# Patient Record
Sex: Female | Born: 1990 | Race: Black or African American | Hispanic: No | Marital: Single | Smoking: Former smoker
Health system: Southern US, Community
[De-identification: ages and names within clinical notes are randomized; demographics above are authoritative.]

## PROBLEM LIST (undated history)

## (undated) ENCOUNTER — Inpatient Hospital Stay (HOSPITAL_COMMUNITY): Payer: Self-pay

## (undated) DIAGNOSIS — B019 Varicella without complication: Secondary | ICD-10-CM

## (undated) DIAGNOSIS — N390 Urinary tract infection, site not specified: Secondary | ICD-10-CM

## (undated) DIAGNOSIS — R519 Headache, unspecified: Secondary | ICD-10-CM

## (undated) DIAGNOSIS — F329 Major depressive disorder, single episode, unspecified: Secondary | ICD-10-CM

## (undated) DIAGNOSIS — F32A Depression, unspecified: Secondary | ICD-10-CM

## (undated) DIAGNOSIS — R51 Headache: Secondary | ICD-10-CM

## (undated) DIAGNOSIS — F1010 Alcohol abuse, uncomplicated: Secondary | ICD-10-CM

## (undated) DIAGNOSIS — F509 Eating disorder, unspecified: Secondary | ICD-10-CM

## (undated) HISTORY — DX: Eating disorder, unspecified: F50.9

## (undated) HISTORY — DX: Headache: R51

## (undated) HISTORY — PX: ROOT CANAL: SHX2363

## (undated) HISTORY — DX: Alcohol abuse, uncomplicated: F10.10

## (undated) HISTORY — DX: Headache, unspecified: R51.9

## (undated) HISTORY — DX: Varicella without complication: B01.9

## (undated) HISTORY — DX: Urinary tract infection, site not specified: N39.0

## (undated) HISTORY — DX: Depression, unspecified: F32.A

## (undated) HISTORY — DX: Major depressive disorder, single episode, unspecified: F32.9

---
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
## 2014-05-15 ENCOUNTER — Other Ambulatory Visit (HOSPITAL_COMMUNITY)
Admission: RE | Admit: 2014-05-15 | Discharge: 2014-05-15 | Disposition: A | Discharge status: Home / Self Care | Payer: 59 | Source: Ambulatory Visit | Attending: Physician Assistant | Admitting: Physician Assistant

## 2014-05-15 ENCOUNTER — Encounter: Payer: Self-pay | Admitting: Physician Assistant

## 2014-05-15 ENCOUNTER — Ambulatory Visit (INDEPENDENT_AMBULATORY_CARE_PROVIDER_SITE_OTHER): Payer: 59 | Admitting: Physician Assistant

## 2014-05-15 VITALS — BP 117/82 | HR 78 | Temp 98.5°F | Resp 16 | Ht 65.25 in | Wt 123.5 lb

## 2014-05-15 DIAGNOSIS — J208 Acute bronchitis due to other specified organisms: Secondary | ICD-10-CM

## 2014-05-15 DIAGNOSIS — Z202 Contact with and (suspected) exposure to infections with a predominantly sexual mode of transmission: Secondary | ICD-10-CM

## 2014-05-15 DIAGNOSIS — B9689 Other specified bacterial agents as the cause of diseases classified elsewhere: Secondary | ICD-10-CM

## 2014-05-15 DIAGNOSIS — N760 Acute vaginitis: Secondary | ICD-10-CM | POA: Insufficient documentation

## 2014-05-15 DIAGNOSIS — Z113 Encounter for screening for infections with a predominantly sexual mode of transmission: Secondary | ICD-10-CM | POA: Insufficient documentation

## 2014-05-15 DIAGNOSIS — J00 Acute nasopharyngitis [common cold]: Secondary | ICD-10-CM

## 2014-05-15 DIAGNOSIS — Z654 Victim of crime and terrorism: Secondary | ICD-10-CM

## 2014-05-15 LAB — ACUTE HEP PANEL AND HEP B SURFACE AB
HCV Ab: NEGATIVE
Hep A IgM: NONREACTIVE
Hep B C IgM: NONREACTIVE
Hep B S Ab: NEGATIVE
Hepatitis B Surface Ag: NEGATIVE

## 2014-05-15 LAB — HIV ANTIBODY (ROUTINE TESTING W REFLEX): HIV 1&2 Ab, 4th Generation: NONREACTIVE

## 2014-05-15 LAB — RPR

## 2014-05-15 MED ORDER — AZITHROMYCIN 250 MG PO TABS
ORAL_TABLET | ORAL | Status: DC
Start: 2014-05-15 — End: 2014-06-14

## 2014-05-15 NOTE — Progress Notes (Signed)
Pre visit review using our clinic review tool, if applicable. No additional management support is needed unless otherwise documented below in the visit note/SLS  

## 2014-05-15 NOTE — Patient Instructions (Signed)
Please stop by the lab. I will call you with all of your results. If anything comes back abnormal, we will treat you accordingly.  Please use the sheets I have given you to set up a counseling appointment.  Program the Crisis Hotline numbers into your phone.  Please follow through with your police report and restraining order.  Make sure to always leave work with a Radio broadcast assistantcoworker. Stay with friends and family some or have someone come stay with you until you have the police involved.   For your respiratory symptoms, please take the antibiotic as directed.  Stay well hydrated.  Use Mucinex for cough and congestion.   Domestic Abuse You are being battered or abused if someone close to you hits, pushes, or physically hurts you in any way. You also are being abused if you are forced into activities. You are being sexually abused if you are forced to have sexual contact of any kind. You are being emotionally abused if you are made to feel worthless or if you are constantly threatened. It is important to remember that help is available. No one has the right to abuse you. PREVENTION OF FURTHER ABUSE  Learn the warning signs of danger. This varies with situations but may include: the use of alcohol, threats, isolation from friends and family, or forced sexual contact. Leave if you feel that violence is going to occur.  If you are attacked or beaten, report it to the police so the abuse is documented. You do not have to press charges. The police can protect you while you or the attackers are leaving. Get the officer's name and badge number and a copy of the report.  Find someone you can trust and tell them what is happening to you: your caregiver, a nurse, clergy member, close friend or family member. Feeling ashamed is natural, but remember that you have done nothing wrong. No one deserves abuse.  It is important to develop a safety plan if you decide to leave the relationship. You may be at risk of harm if your  abuser knows you are leaving. Include the following steps in your plan:  Keep extra clothing for you and your children, medicines, money, important phone numbers and papers, and an extra set of car and house keys at a friend's or neighbor's house.  Tell a supportive friend or family member that you may show up at any time of day or night in an emergency.  If you do not have a close friend or family member, make a list of other safe places to go, such as shelters and crisis centers. Keep an abuse hotline number available.  Consider filing a restraining order if your abuser stalks, harasses, or threatens you in any way. Document Released: 03/19/2000 Document Revised: 06/14/2011 Document Reviewed: 05/28/2010 [facility] Patient Information 2015 [employer], [place]. This information is not intended to replace advice given to you by your health care provider. Make sure you discuss any questions you have with your health care provider.
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
## 2014-05-16 ENCOUNTER — Telehealth: Payer: Self-pay | Admitting: Physician Assistant

## 2014-05-16 LAB — HSV(HERPES SMPLX)ABS-I+II(IGG+IGM)-BLD
HSV 1 Glycoprotein G Ab, IgG: <0.1 IV
HSV 2 GLYCOPROTEIN G AB, IGG: 3.94 IV — AB
Herpes Simplex Vrs I&II-IgM Ab (EIA): 1.57 INDEX — ABNORMAL HIGH

## 2014-05-16 LAB — URINE CYTOLOGY ANCILLARY ONLY
CHLAMYDIA, DNA PROBE: NEGATIVE
NEISSERIA GONORRHEA: NEGATIVE
TRICH (WINDOWPATH): NEGATIVE

## 2014-05-16 NOTE — Telephone Encounter (Signed)
emmi emailed °

## 2014-05-17 ENCOUNTER — Telehealth: Payer: Self-pay | Admitting: Physician Assistant

## 2014-05-17 DIAGNOSIS — N760 Acute vaginitis: Principal | ICD-10-CM

## 2014-05-17 DIAGNOSIS — B9689 Other specified bacterial agents as the cause of diseases classified elsewhere: Secondary | ICD-10-CM

## 2014-05-17 LAB — URINE CYTOLOGY ANCILLARY ONLY
BACTERIAL VAGINITIS: POSITIVE — AB
Bacterial vaginitis: POSITIVE — AB
Bacterial vaginitis: POSITIVE — AB
Bacterial vaginitis: POSITIVE — AB
Candida vaginitis: NEGATIVE

## 2014-05-17 MED ORDER — METRONIDAZOLE 0.75 % EX GEL: CUTANEOUS | Status: DC

## 2014-05-17 NOTE — Telephone Encounter (Signed)
Ancillary repeat test is positive for bacterial vaginitis.  This is due to overgrowth of bacteria in the vagina and is actually pretty common. I have sent in Metrogel to apply to the vagina nightly x 5 days to help clear the infection.

## 2014-05-19 ENCOUNTER — Encounter: Payer: Self-pay | Admitting: Physician Assistant

## 2014-05-19 DIAGNOSIS — J208 Acute bronchitis due to other specified organisms: Secondary | ICD-10-CM

## 2014-05-19 DIAGNOSIS — B9689 Other specified bacterial agents as the cause of diseases classified elsewhere: Secondary | ICD-10-CM | POA: Insufficient documentation

## 2014-05-19 DIAGNOSIS — Z202 Contact with and (suspected) exposure to infections with a predominantly sexual mode of transmission: Secondary | ICD-10-CM | POA: Insufficient documentation

## 2014-05-19 DIAGNOSIS — Z654 Victim of crime and terrorism: Secondary | ICD-10-CM | POA: Insufficient documentation

## 2014-05-19 NOTE — Assessment & Plan Note (Signed)
Rx Azithromycin.  Increase fluids.  Rest.  Mucinex-DM for congestion and cough.  Return precautions discussed with patient.

## 2014-05-19 NOTE — Assessment & Plan Note (Addendum)
Patient endorsed.  Discussed need for patient to involve police and to file a police report.  Patient encouraged to stay with friends and family.  Instructed to take different routes to and from work daily and to always leave work with a co-worker. Emergency numbers, including the crisis hotline given to the patient. Patient instructed that the police are the best bet to ensure her safety, but if she is alone and is fearful of being alone, she is welcome to come hang out in our office.  I will be having our office periodically follow-up with patient to ensure she is following through with police involvement.

## 2014-05-19 NOTE — Assessment & Plan Note (Signed)
Asymptomatic. Will obtain STI testing today.  Will treat if indicated by results.

## 2014-05-20 NOTE — Telephone Encounter (Signed)
Patient Unavailable; female spoken with will have patient return call/SLS

## 2014-05-20 NOTE — Telephone Encounter (Signed)
Patient informed, understood & agreed/SLS  

## 2014-05-21 ENCOUNTER — Telehealth: Payer: Self-pay | Admitting: Physician Assistant

## 2014-05-21 DIAGNOSIS — N760 Acute vaginitis: Principal | ICD-10-CM

## 2014-05-21 DIAGNOSIS — B9689 Other specified bacterial agents as the cause of diseases classified elsewhere: Secondary | ICD-10-CM

## 2014-05-21 MED ORDER — METRONIDAZOLE 500 MG PO TABS: 500.0000 mg | ORAL_TABLET | Freq: Two times a day (BID) | ORAL | Status: DC

## 2014-05-21 NOTE — Telephone Encounter (Signed)
Caller name: Tiasia Relation to pt: self Call back number:  [phone] Pharmacy: [facility] on [address] in [place],   Reason for call:   Patient states that the gel that was prescribed to her is too expensive and would like something else called in.

## 2014-05-21 NOTE — Telephone Encounter (Signed)
Rx Flagyl oral. Take as directed.

## 2014-05-22 MED ORDER — METRONIDAZOLE 500 MG PO TABS: 500.0000 mg | ORAL_TABLET | Freq: Two times a day (BID) | ORAL | Status: DC

## 2014-05-22 NOTE — Telephone Encounter (Signed)
Patient informed, understood & agreed; new Rx to pharmacy requested by patient/SLS

## 2014-05-30 ENCOUNTER — Telehealth: Payer: Self-pay | Admitting: Physician Assistant

## 2014-05-30 NOTE — Telephone Encounter (Signed)
error:[id number] ° °

## 2014-06-14 ENCOUNTER — Telehealth: Payer: Self-pay

## 2014-06-14 NOTE — Telephone Encounter (Signed)
Medication: Review, verify sig & reconcile(including outside meds):  yes Duplicates discarded: n/a DM supply source:  n/a  Local pharmacy: [facility]   Allergies verified:  yes  Immunization Status: Prompted for insurance verification: n/a Flu vaccine--declined Tdap--up to date per patient; unsure of date  A/P:   Changes to FH, PSH or Personal Hx:  Reviewed.  No changes. Pap--DUE--would like to receive a PAP during visit.     Care Teams Updated: ED/Hospital/Urgent Care Visits:  No Prompted for: Updated insurance, contact information, forms:  yes Remind to bring: DPR information, advance directives:  yes  To Discuss with Provider: Nothing at this time.

## 2014-06-17 ENCOUNTER — Encounter: Payer: Self-pay | Admitting: Physician Assistant

## 2014-06-17 ENCOUNTER — Other Ambulatory Visit (HOSPITAL_COMMUNITY)
Admission: RE | Admit: 2014-06-17 | Discharge: 2014-06-17 | Disposition: A | Discharge status: Home / Self Care | Payer: 59 | Source: Ambulatory Visit | Attending: Physician Assistant | Admitting: Physician Assistant

## 2014-06-17 ENCOUNTER — Ambulatory Visit (INDEPENDENT_AMBULATORY_CARE_PROVIDER_SITE_OTHER): Payer: 59 | Admitting: Physician Assistant

## 2014-06-17 VITALS — BP 125/73 | HR 69 | Temp 99.4°F | Resp 16 | Ht 65.25 in | Wt 119.1 lb

## 2014-06-17 DIAGNOSIS — Z0000 Encounter for general adult medical examination without abnormal findings: Secondary | ICD-10-CM

## 2014-06-17 DIAGNOSIS — F411 Generalized anxiety disorder: Secondary | ICD-10-CM

## 2014-06-17 DIAGNOSIS — Z124 Encounter for screening for malignant neoplasm of cervix: Secondary | ICD-10-CM

## 2014-06-17 DIAGNOSIS — Z01419 Encounter for gynecological examination (general) (routine) without abnormal findings: Secondary | ICD-10-CM | POA: Diagnosis present

## 2014-06-17 MED ORDER — CITALOPRAM HYDROBROMIDE 10 MG PO TABS: 10.0000 mg | ORAL_TABLET | Freq: Every day | ORAL | Status: DC

## 2014-06-17 NOTE — Assessment & Plan Note (Signed)
Will begin Citalopram 10 mg daily.  Supportive measures discussed with patient.  Follow-up 1 month.
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
## 2014-06-17 NOTE — Assessment & Plan Note (Signed)
PAP obtained.  Will call with results.

## 2014-06-17 NOTE — Patient Instructions (Signed)
Please go to the lab for blood work.  I will call you with your results. Please start the Citalopram (Celexa) daily as directed. Follow-up in 1 month.  Return sooner if needed.  Preventive Care for Adults A healthy lifestyle and preventive care can promote health and wellness. Preventive health guidelines for women include the following key practices.  A routine yearly physical is a good way to check with your health care provider about your health and preventive screening. It is a chance to share any concerns and updates on your health and to receive a thorough exam.  Visit your dentist for a routine exam and preventive care every 6 months. Brush your teeth twice a day and floss once a day. Good oral hygiene prevents tooth decay and gum disease.  The frequency of eye exams is based on your age, health, family medical history, use of contact lenses, and other factors. Follow your health care provider's recommendations for frequency of eye exams.  Eat a healthy diet. Foods like vegetables, fruits, whole grains, low-fat dairy products, and lean protein foods contain the nutrients you need without too many calories. Decrease your intake of foods high in solid fats, added sugars, and salt. Eat the right amount of calories for you.Get information about a proper diet from your health care provider, if necessary.  Regular physical exercise is one of the most important things you can do for your health. Most adults should get at least 150 minutes of moderate-intensity exercise (any activity that increases your heart rate and causes you to sweat) each week. In addition, most adults need muscle-strengthening exercises on 2 or more days a week.  Maintain a healthy weight. The body mass index (BMI) is a screening tool to identify possible weight problems. It provides an estimate of body fat based on height and weight. Your health care provider can find your BMI and can help you achieve or maintain a healthy  weight.For adults 20 years and older:  A BMI below 18.5 is considered underweight.  A BMI of 18.5 to 24.9 is normal.  A BMI of 25 to 29.9 is considered overweight.  A BMI of 30 and above is considered obese.  Maintain normal blood lipids and cholesterol levels by exercising and minimizing your intake of saturated fat. Eat a balanced diet with plenty of fruit and vegetables. Blood tests for lipids and cholesterol should begin at age 73 and be repeated every 5 years. If your lipid or cholesterol levels are high, you are over 50, or you are at high risk for heart disease, you may need your cholesterol levels checked more frequently.Ongoing high lipid and cholesterol levels should be treated with medicines if diet and exercise are not working.  If you smoke, find out from your health care provider how to quit. If you do not use tobacco, do not start.  Lung cancer screening is recommended for adults aged 48-80 years who are at high risk for developing lung cancer because of a history of smoking. A yearly low-dose CT scan of the lungs is recommended for people who have at least a 30-pack-year history of smoking and are a current smoker or have quit within the past 15 years. A pack year of smoking is smoking an average of 1 pack of cigarettes a day for 1 year (for example: 1 pack a day for 30 years or 2 packs a day for 15 years). Yearly screening should continue until the smoker has stopped smoking for at least 15 years.  Yearly screening should be stopped for people who develop a health problem that would prevent them from having lung cancer treatment.  If you are pregnant, do not drink alcohol. If you are breastfeeding, be very cautious about drinking alcohol. If you are not pregnant and choose to drink alcohol, do not have more than 1 drink per day. One drink is considered to be 12 ounces (355 mL) of beer, 5 ounces (148 mL) of wine, or 1.5 ounces (44 mL) of liquor.  Avoid use of street drugs. Do not  share needles with anyone. Ask for help if you need support or instructions about stopping the use of drugs.  High blood pressure causes heart disease and increases the risk of stroke. Your blood pressure should be checked at least every 1 to 2 years. Ongoing high blood pressure should be treated with medicines if weight loss and exercise do not work.  If you are 26-53 years old, ask your health care provider if you should take aspirin to prevent strokes.  Diabetes screening involves taking a blood sample to check your fasting blood sugar level. This should be done once every 3 years, after age 52, if you are within normal weight and without risk factors for diabetes. Testing should be considered at a younger age or be carried out more frequently if you are overweight and have at least 1 risk factor for diabetes.  Breast cancer screening is essential preventive care for women. You should practice "breast self-awareness." This means understanding the normal appearance and feel of your breasts and may include breast self-examination. Any changes detected, no matter how small, should be reported to a health care provider. Women in their 2s and 30s should have a clinical breast exam (CBE) by a health care provider as part of a regular health exam every 1 to 3 years. After age 67, women should have a CBE every year. Starting at age 22, women should consider having a mammogram (breast X-ray test) every year. Women who have a family history of breast cancer should talk to their health care provider about genetic screening. Women at a high risk of breast cancer should talk to their health care providers about having an MRI and a mammogram every year.  Breast cancer gene (BRCA)-related cancer risk assessment is recommended for women who have family members with BRCA-related cancers. BRCA-related cancers include breast, ovarian, tubal, and peritoneal cancers. Having family members with these cancers may be  associated with an increased risk for harmful changes (mutations) in the breast cancer genes BRCA1 and BRCA2. Results of the assessment will determine the need for genetic counseling and BRCA1 and BRCA2 testing.  Routine pelvic exams to screen for cancer are no longer recommended for nonpregnant women who are considered low risk for cancer of the pelvic organs (ovaries, uterus, and vagina) and who do not have symptoms. Ask your health care provider if a screening pelvic exam is right for you.  If you have had past treatment for cervical cancer or a condition that could lead to cancer, you need Pap tests and screening for cancer for at least 20 years after your treatment. If Pap tests have been discontinued, your risk factors (such as having a new sexual partner) need to be reassessed to determine if screening should be resumed. Some women have medical problems that increase the chance of getting cervical cancer. In these cases, your health care provider may recommend more frequent screening and Pap tests.  The HPV test is an additional  test that may be used for cervical cancer screening. The HPV test looks for the virus that can cause the cell changes on the cervix. The cells collected during the Pap test can be tested for HPV. The HPV test could be used to screen women aged 21 years and older, and should be used in women of any age who have unclear Pap test results. After the age of 65, women should have HPV testing at the same frequency as a Pap test.  Colorectal cancer can be detected and often prevented. Most routine colorectal cancer screening begins at the age of 52 years and continues through age 38 years. However, your health care provider may recommend screening at an earlier age if you have risk factors for colon cancer. On a yearly basis, your health care provider may provide home test kits to check for hidden blood in the stool. Use of a small camera at the end of a tube, to directly examine the  colon (sigmoidoscopy or colonoscopy), can detect the earliest forms of colorectal cancer. Talk to your health care provider about this at age 60, when routine screening begins. Direct exam of the colon should be repeated every 5-10 years through age 94 years, unless early forms of pre-cancerous polyps or small growths are found.  People who are at an increased risk for hepatitis B should be screened for this virus. You are considered at high risk for hepatitis B if:  You were born in a country where hepatitis B occurs often. Talk with your health care provider about which countries are considered high risk.  Your parents were born in a high-risk country and you have not received a shot to protect against hepatitis B (hepatitis B vaccine).  You have HIV or AIDS.  You use needles to inject street drugs.  You live with, or have sex with, someone who has hepatitis B.  You get hemodialysis treatment.  You take certain medicines for conditions like cancer, organ transplantation, and autoimmune conditions.  Hepatitis C blood testing is recommended for all people born from [birth year] through 1965 and any individual with known risks for hepatitis C.  Practice safe sex. Use condoms and avoid high-risk sexual practices to reduce the spread of sexually transmitted infections (STIs). STIs include gonorrhea, chlamydia, syphilis, trichomonas, herpes, HPV, and human immunodeficiency virus (HIV). Herpes, HIV, and HPV are viral illnesses that have no cure. They can result in disability, cancer, and death.  You should be screened for sexually transmitted illnesses (STIs) including gonorrhea and chlamydia if:  You are sexually active and are younger than 24 years.  You are older than 24 years and your health care provider tells you that you are at risk for this type of infection.  Your sexual activity has changed since you were last screened and you are at an increased risk for chlamydia or gonorrhea. Ask your  health care provider if you are at risk.  If you are at risk of being infected with HIV, it is recommended that you take a prescription medicine daily to prevent HIV infection. This is called preexposure prophylaxis (PrEP). You are considered at risk if:  You are a heterosexual woman, are sexually active, and are at increased risk for HIV infection.  You take drugs by injection.  You are sexually active with a partner who has HIV.  Talk with your health care provider about whether you are at high risk of being infected with HIV. If you choose to begin PrEP, you should  first be tested for HIV. You should then be tested every 3 months for as long as you are taking PrEP.  Osteoporosis is a disease in which the bones lose minerals and strength with aging. This can result in serious bone fractures or breaks. The risk of osteoporosis can be identified using a bone density scan. Women ages 31 years and over and women at risk for fractures or osteoporosis should discuss screening with their health care providers. Ask your health care provider whether you should take a calcium supplement or vitamin D to reduce the rate of osteoporosis.  Menopause can be associated with physical symptoms and risks. Hormone replacement therapy is available to decrease symptoms and risks. You should talk to your health care provider about whether hormone replacement therapy is right for you.  Use sunscreen. Apply sunscreen liberally and repeatedly throughout the day. You should seek shade when your shadow is shorter than you. Protect yourself by wearing long sleeves, pants, a wide-brimmed hat, and sunglasses year round, whenever you are outdoors.  Once a month, do a whole body skin exam, using a mirror to look at the skin on your back. Tell your health care provider of new moles, moles that have irregular borders, moles that are larger than a pencil eraser, or moles that have changed in shape or color.  Stay current with  required vaccines (immunizations).  Influenza vaccine. All adults should be immunized every year.  Tetanus, diphtheria, and acellular pertussis (Td, Tdap) vaccine. Pregnant women should receive 1 dose of Tdap vaccine during each pregnancy. The dose should be obtained regardless of the length of time since the last dose. Immunization is preferred during the 27th-36th week of gestation. An adult who has not previously received Tdap or who does not know her vaccine status should receive 1 dose of Tdap. This initial dose should be followed by tetanus and diphtheria toxoids (Td) booster doses every 10 years. Adults with an unknown or incomplete history of completing a 3-dose immunization series with Td-containing vaccines should begin or complete a primary immunization series including a Tdap dose. Adults should receive a Td booster every 10 years.  Varicella vaccine. An adult without evidence of immunity to varicella should receive 2 doses or a second dose if she has previously received 1 dose. Pregnant females who do not have evidence of immunity should receive the first dose after pregnancy. This first dose should be obtained before leaving the health care facility. The second dose should be obtained 4-8 weeks after the first dose.  Human papillomavirus (HPV) vaccine. Females aged 13-26 years who have not received the vaccine previously should obtain the 3-dose series. The vaccine is not recommended for use in pregnant females. However, pregnancy testing is not needed before receiving a dose. If a female is found to be pregnant after receiving a dose, no treatment is needed. In that case, the remaining doses should be delayed until after the pregnancy. Immunization is recommended for any person with an immunocompromised condition through the age of 45 years if she did not get any or all doses earlier. During the 3-dose series, the second dose should be obtained 4-8 weeks after the first dose. The third dose  should be obtained 24 weeks after the first dose and 16 weeks after the second dose.  Zoster vaccine. One dose is recommended for adults aged 4 years or older unless certain conditions are present.  Measles, mumps, and rubella (MMR) vaccine. Adults born before [birth year] generally are considered immune to  measles and mumps. Adults born in [birth year] or later should have 1 or more doses of MMR vaccine unless there is a contraindication to the vaccine or there is laboratory evidence of immunity to each of the three diseases. A routine second dose of MMR vaccine should be obtained at least 28 days after the first dose for students attending postsecondary schools, health care workers, or international travelers. People who received inactivated measles vaccine or an unknown type of measles vaccine during 1963-1967 should receive 2 doses of MMR vaccine. People who received inactivated mumps vaccine or an unknown type of mumps vaccine before 1979 and are at high risk for mumps infection should consider immunization with 2 doses of MMR vaccine. For females of childbearing age, rubella immunity should be determined. If there is no evidence of immunity, females who are not pregnant should be vaccinated. If there is no evidence of immunity, females who are pregnant should delay immunization until after pregnancy. Unvaccinated health care workers born before [birth year] who lack laboratory evidence of measles, mumps, or rubella immunity or laboratory confirmation of disease should consider measles and mumps immunization with 2 doses of MMR vaccine or rubella immunization with 1 dose of MMR vaccine.  Pneumococcal 13-valent conjugate (PCV13) vaccine. When indicated, a person who is uncertain of her immunization history and has no record of immunization should receive the PCV13 vaccine. An adult aged 64 years or older who has certain medical conditions and has not been previously immunized should receive 1 dose of PCV13 vaccine. This PCV13  should be followed with a dose of pneumococcal polysaccharide (PPSV23) vaccine. The PPSV23 vaccine dose should be obtained at least 8 weeks after the dose of PCV13 vaccine. An adult aged 64 years or older who has certain medical conditions and previously received 1 or more doses of PPSV23 vaccine should receive 1 dose of PCV13. The PCV13 vaccine dose should be obtained 1 or more years after the last PPSV23 vaccine dose.  Pneumococcal polysaccharide (PPSV23) vaccine. When PCV13 is also indicated, PCV13 should be obtained first. All adults aged 88 years and older should be immunized. An adult younger than age 93 years who has certain medical conditions should be immunized. Any person who resides in a nursing home or long-term care facility should be immunized. An adult smoker should be immunized. People with an immunocompromised condition and certain other conditions should receive both PCV13 and PPSV23 vaccines. People with human immunodeficiency virus (HIV) infection should be immunized as soon as possible after diagnosis. Immunization during chemotherapy or radiation therapy should be avoided. Routine use of PPSV23 vaccine is not recommended for American Indians, [place] Natives, or people younger than 65 years unless there are medical conditions that require PPSV23 vaccine. When indicated, people who have unknown immunization and have no record of immunization should receive PPSV23 vaccine. One-time revaccination 5 years after the first dose of PPSV23 is recommended for people aged 19-64 years who have chronic kidney failure, nephrotic syndrome, asplenia, or immunocompromised conditions. People who received 1-2 doses of PPSV23 before age 31 years should receive another dose of PPSV23 vaccine at age 74 years or later if at least 5 years have passed since the previous dose. Doses of PPSV23 are not needed for people immunized with PPSV23 at or after age 47 years.  Meningococcal vaccine. Adults with asplenia or  persistent complement component deficiencies should receive 2 doses of quadrivalent meningococcal conjugate (MenACWY-D) vaccine. The doses should be obtained at least 2 months apart. Microbiologists working with certain meningococcal  bacteria, military recruits, people at risk during an outbreak, and people who travel to or live in countries with a high rate of meningitis should be immunized. A first-year college student up through age 40 years who is living in a residence hall should receive a dose if she did not receive a dose on or after her 16th birthday. Adults who have certain high-risk conditions should receive one or more doses of vaccine.  Hepatitis A vaccine. Adults who wish to be protected from this disease, have certain high-risk conditions, work with hepatitis A-infected animals, work in hepatitis A research labs, or travel to or work in countries with a high rate of hepatitis A should be immunized. Adults who were previously unvaccinated and who anticipate close contact with an international adoptee during the first 60 days after arrival in the [place] from a country with a high rate of hepatitis A should be immunized.  Hepatitis B vaccine. Adults who wish to be protected from this disease, have certain high-risk conditions, may be exposed to blood or other infectious body fluids, are household contacts or sex partners of hepatitis B positive people, are clients or workers in certain care facilities, or travel to or work in countries with a high rate of hepatitis B should be immunized.  Haemophilus influenzae type b (Hib) vaccine. A previously unvaccinated person with asplenia or sickle cell disease or having a scheduled splenectomy should receive 1 dose of Hib vaccine. Regardless of previous immunization, a recipient of a hematopoietic stem cell transplant should receive a 3-dose series 6-12 months after her successful transplant. Hib vaccine is not recommended for adults with HIV  infection. Preventive Services / Frequency Ages 2 to 39 years  Blood pressure check.** / Every 1 to 2 years.  Lipid and cholesterol check.** / Every 5 years beginning at age 30.  Clinical breast exam.** / Every 3 years for women in their 49s and 30s.  BRCA-related cancer risk assessment.** / For women who have family members with a BRCA-related cancer (breast, ovarian, tubal, or peritoneal cancers).  Pap test.** / Every 2 years from ages 11 through 64. Every 3 years starting at age 57 through age 62 or 23 with a history of 3 consecutive normal Pap tests.  HPV screening.** / Every 3 years from ages 37 through ages 83 to 32 with a history of 3 consecutive normal Pap tests.  Hepatitis C blood test.** / For any individual with known risks for hepatitis C.  Skin self-exam. / Monthly.  Influenza vaccine. / Every year.  Tetanus, diphtheria, and acellular pertussis (Tdap, Td) vaccine.** / Consult your health care provider. Pregnant women should receive 1 dose of Tdap vaccine during each pregnancy. 1 dose of Td every 10 years.  Varicella vaccine.** / Consult your health care provider. Pregnant females who do not have evidence of immunity should receive the first dose after pregnancy.  HPV vaccine. / 3 doses over 6 months, if 26 and younger. The vaccine is not recommended for use in pregnant females. However, pregnancy testing is not needed before receiving a dose.  Measles, mumps, rubella (MMR) vaccine.** / You need at least 1 dose of MMR if you were born in 1957 or later. You may also need a 2nd dose. For females of childbearing age, rubella immunity should be determined. If there is no evidence of immunity, females who are not pregnant should be vaccinated. If there is no evidence of immunity, females who are pregnant should delay immunization until after pregnancy.  Pneumococcal 13-valent conjugate (PCV13) vaccine.** / Consult your health care provider.  Pneumococcal polysaccharide  (PPSV23) vaccine.** / 1 to 2 doses if you smoke cigarettes or if you have certain conditions.  Meningococcal vaccine.** / 1 dose if you are age 3 to 49 years and a Market researcher living in a residence hall, or have one of several medical conditions, you need to get vaccinated against meningococcal disease. You may also need additional booster doses.  Hepatitis A vaccine.** / Consult your health care provider.  Hepatitis B vaccine.** / Consult your health care provider.  Haemophilus influenzae type b (Hib) vaccine.** / Consult your health care provider. Ages 27 to 92 years  Blood pressure check.** / Every 1 to 2 years.  Lipid and cholesterol check.** / Every 5 years beginning at age 56 years.  Lung cancer screening. / Every year if you are aged 14-80 years and have a 30-pack-year history of smoking and currently smoke or have quit within the past 15 years. Yearly screening is stopped once you have quit smoking for at least 15 years or develop a health problem that would prevent you from having lung cancer treatment.  Clinical breast exam.** / Every year after age 34 years.  BRCA-related cancer risk assessment.** / For women who have family members with a BRCA-related cancer (breast, ovarian, tubal, or peritoneal cancers).  Mammogram.** / Every year beginning at age 56 years and continuing for as long as you are in good health. Consult with your health care provider.  Pap test.** / Every 3 years starting at age 69 years through age 88 or 66 years with a history of 3 consecutive normal Pap tests.  HPV screening.** / Every 3 years from ages 22 years through ages 40 to 52 years with a history of 3 consecutive normal Pap tests.  Fecal occult blood test (FOBT) of stool. / Every year beginning at age 49 years and continuing until age 31 years. You may not need to do this test if you get a colonoscopy every 10 years.  Flexible sigmoidoscopy or colonoscopy.** / Every 5 years for a  flexible sigmoidoscopy or every 10 years for a colonoscopy beginning at age 33 years and continuing until age 42 years.  Hepatitis C blood test.** / For all people born from [birth year] through 1967 and any individual with known risks for hepatitis C.  Skin self-exam. / Monthly.  Influenza vaccine. / Every year.  Tetanus, diphtheria, and acellular pertussis (Tdap/Td) vaccine.** / Consult your health care provider. Pregnant women should receive 1 dose of Tdap vaccine during each pregnancy. 1 dose of Td every 10 years.  Varicella vaccine.** / Consult your health care provider. Pregnant females who do not have evidence of immunity should receive the first dose after pregnancy.  Zoster vaccine.** / 1 dose for adults aged 90 years or older.  Measles, mumps, rubella (MMR) vaccine.** / You need at least 1 dose of MMR if you were born in 1959 or later. You may also need a 2nd dose. For females of childbearing age, rubella immunity should be determined. If there is no evidence of immunity, females who are not pregnant should be vaccinated. If there is no evidence of immunity, females who are pregnant should delay immunization until after pregnancy.  Pneumococcal 13-valent conjugate (PCV13) vaccine.** / Consult your health care provider.  Pneumococcal polysaccharide (PPSV23) vaccine.** / 1 to 2 doses if you smoke cigarettes or if you have certain conditions.  Meningococcal vaccine.** / Consult your health care provider.  Hepatitis A vaccine.** / Consult your health care provider.  Hepatitis B vaccine.** / Consult your health care provider.  Haemophilus influenzae type b (Hib) vaccine.** / Consult your health care provider. Ages 33 years and over  Blood pressure check.** / Every 1 to 2 years.  Lipid and cholesterol check.** / Every 5 years beginning at age 41 years.  Lung cancer screening. / Every year if you are aged 34-80 years and have a 30-pack-year history of smoking and currently smoke or have  quit within the past 15 years. Yearly screening is stopped once you have quit smoking for at least 15 years or develop a health problem that would prevent you from having lung cancer treatment.  Clinical breast exam.** / Every year after age 38 years.  BRCA-related cancer risk assessment.** / For women who have family members with a BRCA-related cancer (breast, ovarian, tubal, or peritoneal cancers).  Mammogram.** / Every year beginning at age 73 years and continuing for as long as you are in good health. Consult with your health care provider.  Pap test.** / Every 3 years starting at age 69 years through age 35 or 35 years with 3 consecutive normal Pap tests. Testing can be stopped between 65 and 70 years with 3 consecutive normal Pap tests and no abnormal Pap or HPV tests in the past 10 years.  HPV screening.** / Every 3 years from ages 61 years through ages 46 or 52 years with a history of 3 consecutive normal Pap tests. Testing can be stopped between 65 and 70 years with 3 consecutive normal Pap tests and no abnormal Pap or HPV tests in the past 10 years.  Fecal occult blood test (FOBT) of stool. / Every year beginning at age 19 years and continuing until age 54 years. You may not need to do this test if you get a colonoscopy every 10 years.  Flexible sigmoidoscopy or colonoscopy.** / Every 5 years for a flexible sigmoidoscopy or every 10 years for a colonoscopy beginning at age 104 years and continuing until age 3 years.  Hepatitis C blood test.** / For all people born from [birth year] through 1967 and any individual with known risks for hepatitis C.  Osteoporosis screening.** / A one-time screening for women ages 72 years and over and women at risk for fractures or osteoporosis.  Skin self-exam. / Monthly.  Influenza vaccine. / Every year.  Tetanus, diphtheria, and acellular pertussis (Tdap/Td) vaccine.** / 1 dose of Td every 10 years.  Varicella vaccine.** / Consult your health care  provider.  Zoster vaccine.** / 1 dose for adults aged 75 years or older.  Pneumococcal 13-valent conjugate (PCV13) vaccine.** / Consult your health care provider.  Pneumococcal polysaccharide (PPSV23) vaccine.** / 1 dose for all adults aged 36 years and older.  Meningococcal vaccine.** / Consult your health care provider.  Hepatitis A vaccine.** / Consult your health care provider.  Hepatitis B vaccine.** / Consult your health care provider.  Haemophilus influenzae type b (Hib) vaccine.** / Consult your health care provider. ** Family history and personal history of risk and conditions may change your health care provider's recommendations. Document Released: 05/18/2001 Document Revised: 08/06/2013 Document Reviewed: 08/17/2010 [facility] Patient Information 2015 [place], [place]. This information is not intended to replace advice given to you by your health care provider. Make sure you discuss any questions you have with your health care provider.

## 2014-06-17 NOTE — Progress Notes (Signed)
Pre visit review using our clinic review tool, if applicable. No additional management support is needed unless otherwise documented below in the visit note/SLS  

## 2014-06-17 NOTE — Assessment & Plan Note (Signed)
I have reviewed the patient's medical history in detail and updated the computerized patient record.  Preventive care discussed today.  Handout given.  PAP obtained today.  Will obtain fasting labs today.

## 2014-06-18 LAB — HEPATIC FUNCTION PANEL
ALK PHOS: 42 U/L (ref 39–117)
ALT: 13 U/L (ref 0–35)
AST: 17 U/L (ref 0–37)
Albumin: 4.5 g/dL (ref 3.5–5.2)
Bilirubin, Direct: 0.1 mg/dL (ref 0.0–0.3)
TOTAL PROTEIN: 7.2 g/dL (ref 6.0–8.3)
Total Bilirubin: 0.4 mg/dL (ref 0.2–1.2)

## 2014-06-18 LAB — URINALYSIS, ROUTINE W REFLEX MICROSCOPIC
Bilirubin Urine: NEGATIVE
Hgb urine dipstick: NEGATIVE
Ketones, ur: NEGATIVE
NITRITE: NEGATIVE
PH: 6 (ref 5.0–8.0)
Specific Gravity, Urine: 1.015 (ref 1.000–1.030)
Total Protein, Urine: NEGATIVE
UROBILINOGEN UA: 0.2 (ref 0.0–1.0)
Urine Glucose: NEGATIVE

## 2014-06-18 LAB — LIPID PANEL
CHOL/HDL RATIO: 2
Cholesterol: 176 mg/dL (ref 0–200)
HDL: 82 mg/dL (ref >39.00)
LDL Cholesterol: 85 mg/dL (ref 0–99)
NonHDL: 94
Triglycerides: 47 mg/dL (ref 0.0–149.0)
VLDL: 9.4 mg/dL (ref 0.0–40.0)

## 2014-06-18 LAB — BASIC METABOLIC PANEL
BUN: 14 mg/dL (ref 6–23)
CALCIUM: 9.7 mg/dL (ref 8.4–10.5)
CO2: 30 mEq/L (ref 19–32)
Chloride: 103 mEq/L (ref 96–112)
Creatinine, Ser: 0.94 mg/dL (ref 0.40–1.20)
GFR: 76.54 mL/min (ref >60.00)
GLUCOSE: 57 mg/dL — AB (ref 70–99)
POTASSIUM: 3.8 meq/L (ref 3.5–5.1)
SODIUM: 137 meq/L (ref 135–145)

## 2014-06-18 LAB — CBC
HCT: 38.3 % (ref 36.0–46.0)
Hemoglobin: 12.7 g/dL (ref 12.0–15.0)
MCHC: 33.2 g/dL (ref 30.0–36.0)
MCV: 85.8 fl (ref 78.0–100.0)
PLATELETS: 260 10*3/uL (ref 150.0–400.0)
RBC: 4.47 Mil/uL (ref 3.87–5.11)
RDW: 15.1 % (ref 11.5–15.5)
WBC: 5.8 10*3/uL (ref 4.0–10.5)

## 2014-06-19 LAB — CYTOLOGY - PAP

## 2014-07-08 ENCOUNTER — Ambulatory Visit (INDEPENDENT_AMBULATORY_CARE_PROVIDER_SITE_OTHER): Payer: 59 | Admitting: Physician Assistant

## 2014-07-08 ENCOUNTER — Encounter: Payer: Self-pay | Admitting: Physician Assistant

## 2014-07-08 VITALS — BP 110/73 | HR 65 | Temp 98.0°F | Resp 14 | Ht 65.25 in | Wt 118.4 lb

## 2014-07-08 DIAGNOSIS — F411 Generalized anxiety disorder: Secondary | ICD-10-CM

## 2014-07-08 MED ORDER — SERTRALINE HCL 50 MG PO TABS: ORAL_TABLET | ORAL | Status: DC

## 2014-07-08 NOTE — Patient Instructions (Signed)
Please stop the Citalopram. Start the Sertraline as directed. Call if you notice any stomach upset or worsening mood. Otherwise follow-up in 2 months.

## 2014-07-08 NOTE — Progress Notes (Signed)
Patient presents to clinic today for follow-up of generalized anxiety after starting Citalopram 10 mg daily.  Patient states she has yet to notice improvement in her symptoms but is having a hard time taking the medication due to significant GI upset to include loose stools and nausea.  Denies worsening of mood or anxiety.  Denies SI/HI.    Past Medical History  Diagnosis Date  . Alcohol abuse     Depression  . Chickenpox   . Depression   . Eating disorder     Bulemia, resolved  . Frequent headaches   . UTI (lower urinary tract infection)     No current outpatient prescriptions on file prior to visit.   No current facility-administered medications on file prior to visit.    Allergies  Allergen Reactions  . Citrus Itching and Nausea And Vomiting    Citrus fruits, Strawberry, Watermelon  . Latex Itching and Rash    Family History  Problem Relation Age of Onset  . Hypertension Mother     Living  . Hypertension Father     Living  . Mental illness Mother   . Depression Mother   . Alcoholism Maternal Grandmother   . Kidney Stones Father   . Sleep apnea Father   . Prostate cancer Paternal Grandfather     Deceased  . Hypertension Maternal Grandmother   . Hypertension Paternal Grandmother   . Arthritis Maternal Grandmother   . Alzheimer's disease Other     Maternal Great Aunt  . Breast cancer Maternal Aunt 50  . HIV Brother   . Mental illness Brother     Possible Bipolar #1  . Healthy Sister     Half-sister    History   Social History  . Marital Status: Unknown    Spouse Name: N/A  . Number of Children: N/A  . Years of Education: N/A   Occupational History  . stylist    Social History Main Topics  . Smoking status: Current Every Day Smoker -- 0.50 packs/day for 5 years    Types: Cigarettes  . Smokeless tobacco: Never Used  . Alcohol Use: 4.2 - 6.0 oz/week    7-10 Standard drinks or equivalent per week  . Drug Use: Yes     Comment: marijuana  . Sexual  Activity: Not Currently   Other Topics Concern  . None   Social History Narrative   Review of Systems - See HPI.  All other ROS are negative.  BP 110/73 mmHg  Pulse 65  Temp(Src) 98 F (36.7 C) (Oral)  Resp 14  Ht 5' 5.25" (1.657 m)  Wt 118 lb 6 oz (53.695 kg)  BMI 19.56 kg/m2  SpO2 100%  LMP 06/23/2014  Physical Exam  Constitutional: She is oriented to person, place, and time and well-developed, well-nourished, and in no distress.  HENT:  Head: Normocephalic and atraumatic.  Pulmonary/Chest: Effort normal.  Neurological: She is alert and oriented to person, place, and time.  Skin: Skin is warm and dry. No rash noted.  Psychiatric: Affect normal.  Vitals reviewed.   Recent Results (from the past 2160 hour(s))  Urine cytology ancillary only     Status: None   Collection Time: 05/15/14 12:00 AM  Result Value Ref Range   Chlamydia CT: Negative     Comment: Normal Reference Range - Negative   Neisseria gonorrhea NG: Negative     Comment: Normal Reference Range - Negative   Trichomonas Trichomonas: Negative     Comment: Normal Reference  Range - Negative  Urine cytology ancillary only     Status: Abnormal   Collection Time: 05/15/14 12:00 AM  Result Value Ref Range   Bacterial vaginitis **POSITIVE for Gardnerella vaginalis** (A)     Comment: Normal Reference Range - Negative   Bacterial vaginitis **POSITIVE for Atopobium vaginae** (A)     Comment: Normal Reference Range - Negative   Bacterial vaginitis **POSITIVE for Megasphaera 1** (A)     Comment: Normal Reference Range - Negative   Bacterial vaginitis **POSITIVE for BVAB2** (A)     Comment: Normal Reference Range - Negative   Candida vaginitis Negative for Candida Vaginitis Microorganisms     Comment: Normal Reference Range - Negative  Acute Hep Panel & Hep B Surface Ab     Status: None   Collection Time: 05/15/14 10:44 AM  Result Value Ref Range   Hepatitis B Surface Ag NEGATIVE NEGATIVE   Hep B C IgM NON  REACTIVE NON REACTIVE    Comment: High levels of Hepatitis B Core IgM antibody are detectable during the acute stage of Hepatitis B. This antibody is used to differentiate current from past HBV infection.      Hep B S Ab NEG NEGATIVE   Hep A IgM NON REACTIVE NON REACTIVE   HCV Ab NEGATIVE NEGATIVE  HIV antibody     Status: None   Collection Time: 05/15/14 10:44 AM  Result Value Ref Range   HIV 1&2 Ab, 4th Generation NONREACTIVE NONREACTIVE    Comment:   A NONREACTIVE HIV Ag/Ab result does not exclude HIV infection since the time frame for seroconversion is variable. If acute HIV infection is suspected, a HIV-1 RNA Qualitative TMA test is recommended.   HIV-1/2 Antibody Diff         Not indicated. HIV-1 RNA, Qual TMA           Not indicated.   PLEASE NOTE: This information has been disclosed to you from records whose confidentiality may be protected by state law. If your state requires such protection, then the state law prohibits you from making any further disclosure of the information without the specific written consent of the person to whom it pertains, or as otherwise permitted by law. A general authorization for the release of medical or other information is NOT sufficient for this purpose.   The performance of this assay has not been clinically validated in patients less than 16 years old.   HSV(herpes smplx)abs-1+2(IgG+IgM)-bld     Status: Abnormal   Collection Time: 05/15/14 10:44 AM  Result Value Ref Range   HSV 1 Glycoprotein G Ab, IgG <0.10 IV    Comment:      IV = Index Value              < 0.90 IV              Negative              0.90-1.10 IV           Equivocal              > 1.10 IV              Positive    HSV 2 Glycoprotein G Ab, IgG 3.94 (H) IV    Comment:      IV = Index Value              < 0.90 IV  Negative              0.90-1.10 IV           Equivocal              > 1.10 IV              Positive    Herpes Simplex Vrs I&II-IgM Ab  (EIA) 1.57 (H) INDEX    Comment:                 <=0.90 . . . . . . Marland Kitchen NEGATIVE               0.91-1.09. . . . . Marland Kitchen EQUIVOCAL               >=1.10 . . . . . . Marland Kitchen POSITIVE                                                                                                   The results obtained with the HSV 1 & 2 IgM test should serve only as an aid to diagnosis and should not be interpreted as diagnostic by themselves. Heterotypic IgM antibody responses may occur in patients with a history of infection with other Herpes viruses, including Epstein-Barr virus and Varicella zoster virus, and give false positive results in HSV 1 & 2 IgM tests.  This test does not distinguish between HSV 1 and HSV 2.  A positive HSV IgM may indicate primary infection, but IgM antibody can persist 12 or more months after primary infection.  For confirmation, if clinically indicated, positive IgM results could be followed with the test for HSV 1 & 2 IgG glycoprotein (test code 16109) in 4-6 weeks.   RPR     Status: None   Collection Time: 05/15/14 10:44 AM  Result Value Ref Range   RPR Ser Ql NON REAC NON REAC  Cytology - PAP     Status: None   Collection Time: 06/17/14 12:00 AM  Result Value Ref Range   CYTOLOGY - PAP PAP RESULT   CBC     Status: None   Collection Time: 06/17/14  3:14 PM  Result Value Ref Range   WBC 5.8 4.0 - 10.5 K/uL   RBC 4.47 3.87 - 5.11 Mil/uL   Platelets 260.0 150.0 - 400.0 K/uL   Hemoglobin 12.7 12.0 - 15.0 g/dL   HCT 60.4 54.0 - 98.1 %   MCV 85.8 78.0 - 100.0 fl   MCHC 33.2 30.0 - 36.0 g/dL   RDW 19.1 47.8 - 29.5 %  Basic metabolic panel     Status: Abnormal   Collection Time: 06/17/14  3:14 PM  Result Value Ref Range   Sodium 137 135 - 145 mEq/L   Potassium 3.8 3.5 - 5.1 mEq/L   Chloride 103 96 - 112 mEq/L   CO2 30 19 - 32 mEq/L   Glucose, Bld 57 (L) 70 - 99 mg/dL   BUN 14 6 - 23 mg/dL   Creatinine, Ser 6.21 0.40 - 1.20 mg/dL   Calcium 9.7 8.4 - 30.8  mg/dL   GFR 16.10  >96.04 mL/min  Hepatic function panel     Status: None   Collection Time: 06/17/14  3:14 PM  Result Value Ref Range   Total Bilirubin 0.4 0.2 - 1.2 mg/dL   Bilirubin, Direct 0.1 0.0 - 0.3 mg/dL   Alkaline Phosphatase 42 39 - 117 U/L   AST 17 0 - 37 U/L   ALT 13 0 - 35 U/L   Total Protein 7.2 6.0 - 8.3 g/dL   Albumin 4.5 3.5 - 5.2 g/dL  Urinalysis, Routine w reflex microscopic     Status: Abnormal   Collection Time: 06/17/14  3:14 PM  Result Value Ref Range   Color, Urine YELLOW Yellow;Lt. Yellow   APPearance CLEAR Clear   Specific Gravity, Urine 1.015 1.000-1.030   pH 6.0 5.0 - 8.0   Total Protein, Urine NEGATIVE Negative   Urine Glucose NEGATIVE Negative   Ketones, ur NEGATIVE Negative   Bilirubin Urine NEGATIVE Negative   Hgb urine dipstick NEGATIVE Negative   Urobilinogen, UA 0.2 0.0 - 1.0   Leukocytes, UA TRACE (A) Negative   Nitrite NEGATIVE Negative   WBC, UA 0-2/hpf 0-2/hpf   RBC / HPF 0-2/hpf 0-2/hpf   Squamous Epithelial / LPF Rare(0-4/hpf) Rare(0-4/hpf)  Lipid panel     Status: None   Collection Time: 06/17/14  3:14 PM  Result Value Ref Range   Cholesterol 176 0 - 200 mg/dL    Comment: ATP III Classification       Desirable:  < 200 mg/dL               Borderline High:  200 - 239 mg/dL          High:  > = 540 mg/dL   Triglycerides 98.1 0.0 - 149.0 mg/dL    Comment: Normal:  <191 mg/dLBorderline High:  150 - 199 mg/dL   HDL 47.82 >95.62 mg/dL   VLDL 9.4 0.0 - 13.0 mg/dL   LDL Cholesterol 85 0 - 99 mg/dL   Total CHOL/HDL Ratio 2     Comment:                Men          Women1/2 Average Risk     3.4          3.3Average Risk          5.0          4.42X Average Risk          9.6          7.13X Average Risk          15.0          11.0                       NonHDL 94.00     Comment: NOTE:  Non-HDL goal should be 30 mg/dL higher than patient's LDL goal (i.e. LDL goal of < 70 mg/dL, would have non-HDL goal of < 100 mg/dL)    Assessment/Plan: Generalized anxiety  disorder Could not tolerate Celexa due to side effects. Will d/c medication.  Will begin Sertraline 25 mg daily x 1 week, then increase to 50 mg Qd.  Potential side effects discussed.  She is to call if any of those occur.  If improving, will follow-up in 2 months.  Reminded her that it takes a few weeks for an SSRI to start being therapeutic.

## 2014-07-08 NOTE — Assessment & Plan Note (Signed)
Could not tolerate Celexa due to side effects. Will d/c medication.  Will begin Sertraline 25 mg daily x 1 week, then increase to 50 mg Qd.  Potential side effects discussed.  She is to call if any of those occur.  If improving, will follow-up in 2 months.  Reminded her that it takes a few weeks for an SSRI to start being therapeutic.

## 2014-07-19 ENCOUNTER — Other Ambulatory Visit (HOSPITAL_COMMUNITY)
Admission: RE | Admit: 2014-07-19 | Discharge: 2014-07-19 | Disposition: A | Discharge status: Home / Self Care | Payer: Self-pay | Source: Ambulatory Visit | Attending: Physician Assistant | Admitting: Physician Assistant

## 2014-07-19 ENCOUNTER — Ambulatory Visit (INDEPENDENT_AMBULATORY_CARE_PROVIDER_SITE_OTHER): Payer: Self-pay | Admitting: Physician Assistant

## 2014-07-19 ENCOUNTER — Encounter: Payer: Self-pay | Admitting: Physician Assistant

## 2014-07-19 VITALS — BP 111/79 | HR 76 | Temp 98.4°F | Resp 16 | Ht 65.25 in | Wt 120.5 lb

## 2014-07-19 DIAGNOSIS — Z113 Encounter for screening for infections with a predominantly sexual mode of transmission: Secondary | ICD-10-CM | POA: Insufficient documentation

## 2014-07-19 DIAGNOSIS — N939 Abnormal uterine and vaginal bleeding, unspecified: Secondary | ICD-10-CM | POA: Insufficient documentation

## 2014-07-19 DIAGNOSIS — N760 Acute vaginitis: Secondary | ICD-10-CM | POA: Insufficient documentation

## 2014-07-19 DIAGNOSIS — N898 Other specified noninflammatory disorders of vagina: Secondary | ICD-10-CM

## 2014-07-19 LAB — POCT URINALYSIS DIPSTICK
Bilirubin, UA: NEGATIVE
Glucose, UA: NEGATIVE
Ketones, UA: NEGATIVE
Nitrite, UA: NEGATIVE
PH UA: 7
Protein, UA: NEGATIVE
Spec Grav, UA: 1.015
Urobilinogen, UA: 0.2

## 2014-07-19 NOTE — Progress Notes (Signed)
Patient presents to clinic today c/o brownish vaginal discharge 1.5 weeks. Patient endorses discharge in non-odorous and on[facility] monitor will check a few things that she has no pain she had several months. We checked her for STDs ly in very scant amount.  Patient denies pelvic pain, urinary symptoms.  Last episode of intercourse > 1 month ago.  Has had period since that time.  Is due for next menstrual period as last finished on 20th of last month.   Past Medical History  Diagnosis Date  . Alcohol abuse     Depression  . Chickenpox   . Depression   . Eating disorder     Bulemia, resolved  . Frequent headaches   . UTI (lower urinary tract infection)     Current Outpatient Prescriptions on File Prior to Visit  Medication Sig Dispense Refill  . sertraline (ZOLOFT) 50 MG tablet Take 1/2 tablet by mouth daily x 7 days. Then increase to 1 tablet daily. 30 tablet 1   No current facility-administered medications on file prior to visit.    Allergies  Allergen Reactions  . Citrus Itching and Nausea And Vomiting    Citrus fruits, Strawberry, Watermelon  . Latex Itching and Rash    Family History  Problem Relation Age of Onset  . Hypertension Mother     Living  . Hypertension Father     Living  . Mental illness Mother   . Depression Mother   . Alcoholism Maternal Grandmother   . Kidney Stones Father   . Sleep apnea Father   . Prostate cancer Paternal Grandfather     Deceased  . Hypertension Maternal Grandmother   . Hypertension Paternal Grandmother   . Arthritis Maternal Grandmother   . Alzheimer's disease Other     Maternal Great Aunt  . Breast cancer Maternal Aunt 50  . HIV Brother   . Mental illness Brother     Possible Bipolar #1  . Healthy Sister     Half-sister    History   Social History  . Marital Status: Unknown    Spouse Name: N/A  . Number of Children: N/A  . Years of Education: N/A   Occupational History  . stylist    Social History Main Topics   . Smoking status: Current Every Day Smoker -- 0.50 packs/day for 5 years    Types: Cigarettes  . Smokeless tobacco: Never Used  . Alcohol Use: 4.2 - 6.0 oz/week    7-10 Standard drinks or equivalent per week  . Drug Use: Yes     Comment: marijuana  . Sexual Activity: Not Currently   Other Topics Concern  . None   Social History Narrative    Review of Systems - See HPI.  All other ROS are negative.  BP 111/79 mmHg  Pulse 76  Temp(Src) 98.4 F (36.9 C) (Oral)  Resp 16  Ht 5' 5.25" (1.657 m)  Wt 120 lb 8 oz (54.658 kg)  BMI 19.91 kg/m2  SpO2 100%  LMP 06/23/2014  Physical Exam  Constitutional: She is well-developed, well-nourished, and in no distress.  HENT:  Head: Normocephalic and atraumatic.  Eyes: Conjunctivae are normal.  Cardiovascular: Normal rate, regular rhythm, normal heart sounds and intact distal pulses.   Pulmonary/Chest: Effort normal and breath sounds normal. No respiratory distress. She has no wheezes. She has no rales. She exhibits no tenderness.  Abdominal: Soft. Bowel sounds are normal. She exhibits no distension and no mass. There is no tenderness. There is  no rebound and no guarding.  Genitourinary:  Patient defers.  Skin: Skin is warm and dry. No rash noted.  Psychiatric: Affect normal.  Vitals reviewed.   Recent Results (from the past 2160 hour(s))  Urine cytology ancillary only     Status: None   Collection Time: 05/15/14 12:00 AM  Result Value Ref Range   Chlamydia CT: Negative     Comment: Normal Reference Range - Negative   Neisseria gonorrhea NG: Negative     Comment: Normal Reference Range - Negative   Trichomonas Trichomonas: Negative     Comment: Normal Reference Range - Negative  Urine cytology ancillary only     Status: Abnormal   Collection Time: 05/15/14 12:00 AM  Result Value Ref Range   Bacterial vaginitis **POSITIVE for Gardnerella vaginalis** (A)     Comment: Normal Reference Range - Negative   Bacterial vaginitis  **POSITIVE for Atopobium vaginae** (A)     Comment: Normal Reference Range - Negative   Bacterial vaginitis **POSITIVE for Megasphaera 1** (A)     Comment: Normal Reference Range - Negative   Bacterial vaginitis **POSITIVE for BVAB2** (A)     Comment: Normal Reference Range - Negative   Candida vaginitis Negative for Candida Vaginitis Microorganisms     Comment: Normal Reference Range - Negative  Acute Hep Panel & Hep B Surface Ab     Status: None   Collection Time: 05/15/14 10:44 AM  Result Value Ref Range   Hepatitis B Surface Ag NEGATIVE NEGATIVE   Hep B C IgM NON REACTIVE NON REACTIVE    Comment: High levels of Hepatitis B Core IgM antibody are detectable during the acute stage of Hepatitis B. This antibody is used to differentiate current from past HBV infection.      Hep B S Ab NEG NEGATIVE   Hep A IgM NON REACTIVE NON REACTIVE   HCV Ab NEGATIVE NEGATIVE  HIV antibody     Status: None   Collection Time: 05/15/14 10:44 AM  Result Value Ref Range   HIV 1&2 Ab, 4th Generation NONREACTIVE NONREACTIVE    Comment:   A NONREACTIVE HIV Ag/Ab result does not exclude HIV infection since the time frame for seroconversion is variable. If acute HIV infection is suspected, a HIV-1 RNA Qualitative TMA test is recommended.   HIV-1/2 Antibody Diff         Not indicated. HIV-1 RNA, Qual TMA           Not indicated.   PLEASE NOTE: This information has been disclosed to you from records whose confidentiality may be protected by state law. If your state requires such protection, then the state law prohibits you from making any further disclosure of the information without the specific written consent of the person to whom it pertains, or as otherwise permitted by law. A general authorization for the release of medical or other information is NOT sufficient for this purpose.   The performance of this assay has not been clinically validated in patients less than 56 years old.   HSV(herpes  smplx)abs-1+2(IgG+IgM)-bld     Status: Abnormal   Collection Time: 05/15/14 10:44 AM  Result Value Ref Range   HSV 1 Glycoprotein G Ab, IgG <0.10 IV    Comment:      IV = Index Value              < 0.90 IV              Negative  0.90-1.10 IV           Equivocal              > 1.10 IV              Positive    HSV 2 Glycoprotein G Ab, IgG 3.94 (H) IV    Comment:      IV = Index Value              < 0.90 IV              Negative              0.90-1.10 IV           Equivocal              > 1.10 IV              Positive    Herpes Simplex Vrs I&II-IgM Ab (EIA) 1.57 (H) INDEX    Comment:                 <=0.90 . . . . . . Marland Kitchen NEGATIVE               0.91-1.09. . . . . Marland Kitchen EQUIVOCAL               >=1.10 . . . . . . Marland Kitchen POSITIVE                                                                                                   The results obtained with the HSV 1 & 2 IgM test should serve only as an aid to diagnosis and should not be interpreted as diagnostic by themselves. Heterotypic IgM antibody responses may occur in patients with a history of infection with other Herpes viruses, including Epstein-Barr virus and Varicella zoster virus, and give false positive results in HSV 1 & 2 IgM tests.  This test does not distinguish between HSV 1 and HSV 2.  A positive HSV IgM may indicate primary infection, but IgM antibody can persist 12 or more months after primary infection.  For confirmation, if clinically indicated, positive IgM results could be followed with the test for HSV 1 & 2 IgG glycoprotein (test code 71696) in 4-6 weeks.   RPR     Status: None   Collection Time: 05/15/14 10:44 AM  Result Value Ref Range   RPR Ser Ql NON REAC NON REAC  Cytology - PAP     Status: None   Collection Time: 06/17/14 12:00 AM  Result Value Ref Range   CYTOLOGY - PAP PAP RESULT   CBC     Status: None   Collection Time: 06/17/14  3:14 PM  Result Value Ref Range   WBC 5.8 4.0 - 10.5 K/uL   RBC  4.47 3.87 - 5.11 Mil/uL   Platelets 260.0 150.0 - 400.0 K/uL   Hemoglobin 12.7 12.0 - 15.0 g/dL   HCT 78.9 38.1 - 01.7 %   MCV 85.8 78.0 - 100.0 fl   MCHC 33.2 30.0 - 36.0 g/dL  RDW 15.1 11.5 - 15.5 %  Basic metabolic panel     Status: Abnormal   Collection Time: 06/17/14  3:14 PM  Result Value Ref Range   Sodium 137 135 - 145 mEq/L   Potassium 3.8 3.5 - 5.1 mEq/L   Chloride 103 96 - 112 mEq/L   CO2 30 19 - 32 mEq/L   Glucose, Bld 57 (L) 70 - 99 mg/dL   BUN 14 6 - 23 mg/dL   Creatinine, Ser 1.61 0.40 - 1.20 mg/dL   Calcium 9.7 8.4 - 09.6 mg/dL   GFR 04.54 >09.81 mL/min  Hepatic function panel     Status: None   Collection Time: 06/17/14  3:14 PM  Result Value Ref Range   Total Bilirubin 0.4 0.2 - 1.2 mg/dL   Bilirubin, Direct 0.1 0.0 - 0.3 mg/dL   Alkaline Phosphatase 42 39 - 117 U/L   AST 17 0 - 37 U/L   ALT 13 0 - 35 U/L   Total Protein 7.2 6.0 - 8.3 g/dL   Albumin 4.5 3.5 - 5.2 g/dL  Urinalysis, Routine w reflex microscopic     Status: Abnormal   Collection Time: 06/17/14  3:14 PM  Result Value Ref Range   Color, Urine YELLOW Yellow;Lt. Yellow   APPearance CLEAR Clear   Specific Gravity, Urine 1.015 1.000-1.030   pH 6.0 5.0 - 8.0   Total Protein, Urine NEGATIVE Negative   Urine Glucose NEGATIVE Negative   Ketones, ur NEGATIVE Negative   Bilirubin Urine NEGATIVE Negative   Hgb urine dipstick NEGATIVE Negative   Urobilinogen, UA 0.2 0.0 - 1.0   Leukocytes, UA TRACE (A) Negative   Nitrite NEGATIVE Negative   WBC, UA 0-2/hpf 0-2/hpf   RBC / HPF 0-2/hpf 0-2/hpf   Squamous Epithelial / LPF Rare(0-4/hpf) Rare(0-4/hpf)  Lipid panel     Status: None   Collection Time: 06/17/14  3:14 PM  Result Value Ref Range   Cholesterol 176 0 - 200 mg/dL    Comment: ATP III Classification       Desirable:  < 200 mg/dL               Borderline High:  200 - 239 mg/dL          High:  > = 191 mg/dL   Triglycerides 47.8 0.0 - 149.0 mg/dL    Comment: Normal:  <295 mg/dLBorderline High:   150 - 199 mg/dL   HDL 62.13 >08.65 mg/dL   VLDL 9.4 0.0 - 78.4 mg/dL   LDL Cholesterol 85 0 - 99 mg/dL   Total CHOL/HDL Ratio 2     Comment:                Men          Women1/2 Average Risk     3.4          3.3Average Risk          5.0          4.42X Average Risk          9.6          7.13X Average Risk          15.0          11.0                       NonHDL 94.00     Comment: NOTE:  Non-HDL goal should be 30 mg/dL higher than patient's LDL goal (  i.e. LDL goal of < 70 mg/dL, would have non-HDL goal of < 100 mg/dL)    Assessment/Plan: Vaginal spotting Otherwise Asymptomatic.  Patient due for period at the moment.  Urine pregnancy negative.  Urine dip overall unremarkable.  Will obtain urine ancillary and urine culture.

## 2014-07-19 NOTE — Patient Instructions (Signed)
Please continue to monitor symptoms. I will call you with your results. I would be very surprised if you don't start your next period very soon. Call if anything recurs or anything new occurs.

## 2014-07-19 NOTE — Assessment & Plan Note (Signed)
Otherwise Asymptomatic.  Patient due for period at the moment.  Urine pregnancy negative.  Urine dip overall unremarkable.  Will obtain urine ancillary and urine culture.

## 2014-07-19 NOTE — Progress Notes (Signed)
Pre visit review using our clinic review tool, if applicable. No additional management support is needed unless otherwise documented below in the visit note/SLS  

## 2014-07-19 NOTE — Addendum Note (Signed)
Addended by: Regis BillSCATES, SHARON L on: 07/19/2014 11:48 AM   Modules accepted: Orders

## 2014-07-22 LAB — URINE CYTOLOGY ANCILLARY ONLY
CHLAMYDIA, DNA PROBE: NEGATIVE
NEISSERIA GONORRHEA: NEGATIVE
Trichomonas: NEGATIVE

## 2014-07-23 LAB — URINE CYTOLOGY ANCILLARY ONLY
Bacterial vaginitis: NEGATIVE
Candida vaginitis: NEGATIVE

## 2014-07-23 LAB — CULTURE, URINE COMPREHENSIVE: Colony Count: 85000

## 2015-04-06 NOTE — L&D Delivery Note (Addendum)
Delivery Note At 12:23 PM a viable female was delivered via Vaginal, Spontaneous Delivery (Presentation: verxex ; ROA  ).  APGAR: 8, 8; weight 5 lb 2.5 oz (2340 g).   Placenta status: delivered intact manually after avulsion of the umbilical cord.  Cord:  3 vessel with the following complications:body cord x1.    Anesthesia:  epidural Episiotomy: None Lacerations: 1st degree, labial, Patient did have a extension of her labial tear alond the outer margin of her whole right labia minora. I was called to examine the patient after delivery and she appears to be developing a left labia hemaotma. IT does not seem to be enlarging at this point. Continue to monitor.  Suture Repair: 3.0 vicryl Est. Blood Loss (mL): 200    Mom to postpartum.  Baby to Couplet care / Skin to Skin.  Patient given a dose of ancef following manual removal of palcenta.  Ernestina Pennaicholas Dawood Spitler 02/09/2016, 3:59 PM

## 2015-06-16 ENCOUNTER — Encounter (HOSPITAL_COMMUNITY): Payer: Self-pay | Admitting: Emergency Medicine

## 2015-06-16 DIAGNOSIS — Z8659 Personal history of other mental and behavioral disorders: Secondary | ICD-10-CM | POA: Insufficient documentation

## 2015-06-16 DIAGNOSIS — O209 Hemorrhage in early pregnancy, unspecified: Secondary | ICD-10-CM | POA: Diagnosis present

## 2015-06-16 DIAGNOSIS — Z87440 Personal history of urinary (tract) infections: Secondary | ICD-10-CM | POA: Diagnosis not present

## 2015-06-16 DIAGNOSIS — Z91040 Latex allergy status: Secondary | ICD-10-CM | POA: Diagnosis not present

## 2015-06-16 DIAGNOSIS — F17210 Nicotine dependence, cigarettes, uncomplicated: Secondary | ICD-10-CM | POA: Diagnosis not present

## 2015-06-16 DIAGNOSIS — O039 Complete or unspecified spontaneous abortion without complication: Secondary | ICD-10-CM | POA: Insufficient documentation

## 2015-06-16 DIAGNOSIS — Z8619 Personal history of other infectious and parasitic diseases: Secondary | ICD-10-CM | POA: Diagnosis not present

## 2015-06-16 LAB — COMPREHENSIVE METABOLIC PANEL
ALK PHOS: 41 U/L (ref 38–126)
ALT: 10 U/L — AB (ref 14–54)
AST: 16 U/L (ref 15–41)
Albumin: 3.9 g/dL (ref 3.5–5.0)
Anion gap: 9 (ref 5–15)
BUN: 9 mg/dL (ref 6–20)
CALCIUM: 9.6 mg/dL (ref 8.9–10.3)
CO2: 23 mmol/L (ref 22–32)
CREATININE: 0.67 mg/dL (ref 0.44–1.00)
Chloride: 105 mmol/L (ref 101–111)
Glucose, Bld: 87 mg/dL (ref 65–99)
Potassium: 4 mmol/L (ref 3.5–5.1)
SODIUM: 137 mmol/L (ref 135–145)
Total Bilirubin: 0.2 mg/dL — ABNORMAL LOW (ref 0.3–1.2)
Total Protein: 6.6 g/dL (ref 6.5–8.1)

## 2015-06-16 LAB — URINE MICROSCOPIC-ADD ON

## 2015-06-16 LAB — CBC WITH DIFFERENTIAL/PLATELET
BASOS PCT: 0 %
Basophils Absolute: 0 10*3/uL (ref 0.0–0.1)
EOS ABS: 0.2 10*3/uL (ref 0.0–0.7)
EOS PCT: 2 %
HCT: 33.9 % — ABNORMAL LOW (ref 36.0–46.0)
HEMOGLOBIN: 11 g/dL — AB (ref 12.0–15.0)
Lymphocytes Relative: 25 %
Lymphs Abs: 2.1 10*3/uL (ref 0.7–4.0)
MCH: 27.2 pg (ref 26.0–34.0)
MCHC: 32.4 g/dL (ref 30.0–36.0)
MCV: 83.9 fL (ref 78.0–100.0)
MONO ABS: 0.6 10*3/uL (ref 0.1–1.0)
MONOS PCT: 8 %
NEUTROS PCT: 65 %
Neutro Abs: 5.4 10*3/uL (ref 1.7–7.7)
PLATELETS: 287 10*3/uL (ref 150–400)
RBC: 4.04 MIL/uL (ref 3.87–5.11)
RDW: 14.1 % (ref 11.5–15.5)
WBC: 8.4 10*3/uL (ref 4.0–10.5)

## 2015-06-16 LAB — HCG, QUANTITATIVE, PREGNANCY: hCG, Beta Chain, Quant, S: 44801 m[IU]/mL — ABNORMAL HIGH (ref <5)

## 2015-06-16 LAB — URINALYSIS, ROUTINE W REFLEX MICROSCOPIC
BILIRUBIN URINE: NEGATIVE
Glucose, UA: NEGATIVE mg/dL
KETONES UR: NEGATIVE mg/dL
Leukocytes, UA: NEGATIVE
NITRITE: NEGATIVE
PROTEIN: NEGATIVE mg/dL
Specific Gravity, Urine: 1.019 (ref 1.005–1.030)
pH: 6.5 (ref 5.0–8.0)

## 2015-06-16 NOTE — ED Notes (Signed)
Pt. reports vaginal bleeding onset 3 days ago with mild left abdominal pain , denies fever or chills , no emesis or diarrhea . Pt. stated positive urine preg test .

## 2015-06-17 ENCOUNTER — Emergency Department (HOSPITAL_COMMUNITY)
Admission: EM | Admit: 2015-06-17 | Discharge: 2015-06-17 | Disposition: A | Discharge status: Home / Self Care | Payer: Medicaid Other | Attending: Emergency Medicine | Admitting: Emergency Medicine

## 2015-06-17 ENCOUNTER — Emergency Department (HOSPITAL_COMMUNITY): Payer: Medicaid Other

## 2015-06-17 DIAGNOSIS — O039 Complete or unspecified spontaneous abortion without complication: Secondary | ICD-10-CM

## 2015-06-17 DIAGNOSIS — N939 Abnormal uterine and vaginal bleeding, unspecified: Secondary | ICD-10-CM

## 2015-06-17 LAB — WET PREP, GENITAL
SPERM: NONE SEEN
TRICH WET PREP: NONE SEEN
YEAST WET PREP: NONE SEEN

## 2015-06-17 LAB — GC/CHLAMYDIA PROBE AMP (~~LOC~~) NOT AT ARMC
CHLAMYDIA, DNA PROBE: NEGATIVE
Neisseria Gonorrhea: NEGATIVE

## 2015-06-17 LAB — TYPE AND SCREEN
ABO/RH(D): A POS
Antibody Screen: NEGATIVE

## 2015-06-17 LAB — ABO/RH: ABO/RH(D): A POS

## 2015-06-17 NOTE — ED Notes (Signed)
Patient transported to Ultrasound 

## 2015-06-17 NOTE — ED Notes (Signed)
Dr. Horton at bedside. 

## 2015-06-17 NOTE — ED Provider Notes (Signed)
CSN: [id number]     Arrival date & time 06/16/15  2003 History  By signing my name below, I, Julia Webb, attest that this documentation has been prepared under the direction and in the presence of Shon Baton, MD. Electronically Signed: Bethel Webb, ED Scribe. 06/17/2015. 3:58 AM   Chief Complaint  Patient presents with  . Vaginal Bleeding   The history is provided by the patient. No language interpreter was used.   Julia Webb is a 25 y.o. female who presents to the Emergency Department complaining of new vaginal bleeding with onset 3 days ago. The bleeding was initially light spotting but progressed to heavy "like a period" yesterday. In the last 24 hours she has used 4-5 pads. Pt states that she had a positive home pregnancy test last week. Associated symptoms include 2/10 in severity bilateral lower abdominal pain and light head.Her blood type is A+. This is her first pregnancy. LNMP 05/06/15. She has a scheduled appointment with her OB on Thursday.  Past Medical History  Diagnosis Date  . Alcohol abuse     Depression  . Chickenpox   . Depression   . Eating disorder     Bulemia, resolved  . Frequent headaches   . UTI (lower urinary tract infection)    Past Surgical History  Procedure Laterality Date  . Root canal     Family History  Problem Relation Age of Onset  . Hypertension Mother     Living  . Hypertension Father     Living  . Mental illness Mother   . Depression Mother   . Alcoholism Maternal Grandmother   . Kidney Stones Father   . Sleep apnea Father   . Prostate cancer Paternal Grandfather     Deceased  . Hypertension Maternal Grandmother   . Hypertension Paternal Grandmother   . Arthritis Maternal Grandmother   . Alzheimer's disease Other     Maternal Great Aunt  . Breast cancer Maternal Aunt 50  . HIV Brother   . Mental illness Brother     Possible Bipolar #1  . Healthy Sister     Half-sister   Social History  Substance Use Topics   . Smoking status: Current Every Day Smoker -- 0.00 packs/day for 0 years    Types: Cigarettes  . Smokeless tobacco: Never Used  . Alcohol Use: Yes   OB History    No data available     Review of Systems  Gastrointestinal: Positive for abdominal pain. Negative for nausea and vomiting.  Genitourinary: Positive for vaginal bleeding.  Neurological: Positive for light-headedness.  All other systems reviewed and are negative.  Allergies  Citrus and Latex  Home Medications   Prior to Admission medications   Medication Sig Start Date End Date Taking? Authorizing Provider  sertraline (ZOLOFT) 50 MG tablet Take 1/2 tablet by mouth daily x 7 days. Then increase to 1 tablet daily. Patient not taking: Reported on 06/17/2015 07/08/14   Waldon Merl, PA-C   BP 133/83 mmHg  Pulse 74  Temp(Src) 98.2 F (36.8 C) (Oral)  Resp 20  Ht  (1.626 m)  Wt 127 lb 7 oz (57.805 kg)  BMI 21.86 kg/m2  SpO2 100%  LMP 04/26/2015 Physical Exam  Constitutional: She is oriented to person, place, and time. She appears well-developed and well-nourished. No distress.  HENT:  Head: Normocephalic and atraumatic.  Cardiovascular: Normal rate, regular rhythm and normal heart sounds.   No murmur heard. Pulmonary/Chest: Effort normal and breath sounds  normal. No respiratory distress. She has no wheezes.  Abdominal: Soft. Bowel sounds are normal. There is no tenderness. There is no rebound and no guarding.  Genitourinary:  Dark blood noted in the vaginal vault, cervical os is closed, no significant tenderness or adnexal tenderness  Neurological: She is alert and oriented to person, place, and time.  Skin: Skin is warm and dry.  Psychiatric: She has a normal mood and affect.  Nursing note and vitals reviewed.   ED Course  Procedures (including critical care time) DIAGNOSTIC STUDIES: Oxygen Saturation is 100% on RA,  normal by my interpretation.    COORDINATION OF CARE: 3:55 AM Discussed treatment  plan which includes pelvic exam, lab work, and US with pt at bedside and pt agreed to plan.  Labs Review Labs Reviewed  WET PREP, GENITAL - Abnormal; Notable for the following:    Clue Cells Wet Prep HPF POC PRESENT (*)    WBC, Wet Prep HPF POC MODERATE (*)    All other components within normal limits  HCG, QUANTITATIVE, PREGNANCY - Abnormal; Notable for the following:    hCG, Beta Chain, Quant, S 44801 (*)    All other components within normal limits  COMPREHENSIVE METABOLIC PANEL - Abnormal; Notable for the following:    ALT 10 (*)    Total Bilirubin 0.2 (*)    All other components within normal limits  CBC WITH DIFFERENTIAL/PLATELET - Abnormal; Notable for the following:    Hemoglobin 11.0 (*)    HCT 33.9 (*)    All other components within normal limits  URINALYSIS, ROUTINE W REFLEX MICROSCOPIC (NOT AT [facility]) - Abnormal; Notable for the following:    APPearance CLOUDY (*)    Hgb urine dipstick LARGE (*)    All other components within normal limits  URINE MICROSCOPIC-ADD ON - Abnormal; Notable for the following:    Squamous Epithelial / LPF 6-30 (*)    Bacteria, UA FEW (*)    All other components within normal limits  TYPE AND SCREEN  ABO/RH  GC/CHLAMYDIA PROBE AMP ([place]) NOT AT [facility]    Imaging Review [place] Ob Comp Less 14 Wks  06/17/2015  CLINICAL DATA:  Vaginal bleeding, gestational age by last menstrual period 7 weeks and 3 days. Beta HCG 44,801. EXAM: OBSTETRIC <14 WK US AND TRANSVAGINAL OB US TECHNIQUE: Both transabdominal and transvaginal ultrasound examinations were performed for complete evaluation of the gestation as well as the maternal uterus, adnexal regions, and pelvic cul-de-sac. Transvaginal technique was performed to assess early pregnancy. COMPARISON:  None. FINDINGS: Intrauterine gestational sac: Present though is somewhat misshapen. Yolk sac:  Present Embryo:  Not present Cardiac Activity: Not present MSD: 13  mm   6 w   1  d Subchorionic hemorrhage: Large  1.9 x 2.8 x 2.7 cm subchorionic hemorrhage. Maternal uterus/adnexae: Normal appearance of the adnexae, small LEFT corpus luteal cyst. No free fluid. IMPRESSION: Probable early intrauterine misshapen gestational sac, but no yolk sac, fetal pole, or cardiac activity yet visualized. Large subchorionic hemorrhage. Recommend follow-up quantitative B-HCG levels and follow-up US in 14 days to confirm and assess viability. This recommendation follows SRU consensus guidelines: Diagnostic Criteria for Nonviable Pregnancy Early in the First Trimester. Malva Limes Engl J Med 2013; 161:0960-45; 369:1443-51. Electronically Signed   By: Awilda Metroourtnay  Bloomer M.D.   On: 06/17/2015 04:48   [place] Ob Transvaginal  06/17/2015  CLINICAL DATA:  Vaginal bleeding, gestational age by last menstrual period 7 weeks and 3 days. Beta HCG 44,801. EXAM: OBSTETRIC <14  WK [place] AND TRANSVAGINAL OB US TECHNIQUE: Both transabdominal and transvaginal ultrasound examinations were performed for complete evaluation of the gestation as well as the maternal uterus, adnexal regions, and pelvic cul-de-sac. Transvaginal technique was performed to assess early pregnancy. COMPARISON:  None. FINDINGS: Intrauterine gestational sac: Present though is somewhat misshapen. Yolk sac:  Present Embryo:  Not present Cardiac Activity: Not present MSD: 13  mm   6 w   1  d Subchorionic hemorrhage: Large 1.9 x 2.8 x 2.7 cm subchorionic hemorrhage. Maternal uterus/adnexae: Normal appearance of the adnexae, small LEFT corpus luteal cyst. No free fluid. IMPRESSION: Probable early intrauterine misshapen gestational sac, but no yolk sac, fetal pole, or cardiac activity yet visualized. Large subchorionic hemorrhage. Recommend follow-up quantitative B-HCG levels and follow-up US in 14 days to confirm and assess viability. This recommendation follows SRU consensus guidelines: Diagnostic Criteria for Nonviable Pregnancy Early in the First Trimester. Malva Limes Med 2013; 161:0960-45. Electronically Signed   By:  Awilda Metro M.D.   On: 06/17/2015 04:48   I have personally reviewed and evaluated these images and lab results as part of my medical decision-making.   EKG Interpretation None      MDM   Final diagnoses:  Miscarriage    Patient presents with vaginal bleeding. Positive pregnancy test at home. Beta hCG greater than 44,000. She has no significant tenderness on exam and dark bleeding with a closed cervical os. Only a gestational sac was visualized on exam. There is also a large subchorionic hemorrhage. Given her beta hCG, would expect there to be a yolk sac or fetal pole. Suspect this is likely a nonviable pregnancy. There was no evidence of further ectopic. She's not exquisitely tender. Discussed the results with the patient. She was given miscarriage cautions. Discussed with her that this was likely a nonviable gestation. Follow-up with OB as scheduled on Thursday. Patient was provided with her ultrasound and beta hCG result. At minimum she will need repeat ultrasound and beta hCG in 2 weeks.  After history, exam, and medical workup I feel the patient has been appropriately medically screened and is safe for discharge home. Pertinent diagnoses were discussed with the patient. Patient was given return precautions.  I personally performed the services described in this documentation, which was scribed in my presence. The recorded information has been reviewed and is accurate.    Shon Baton, MD 06/17/15 [phone]

## 2015-06-17 NOTE — Discharge Instructions (Signed)
You were seen today for bleeding during early pregnancy. There were abnormalities on your ultrasound suggesting that the pregnancy is not viable. There was no evidence of ectopic pregnancy. You need a repeat beta hCG and ultrasound in 2 weeks. Follow-up with her OB as previously scheduled on Thursday. If you have any new or worsening symptoms, worsening abdominal pain, or you are bleeding more than 2 pads per hour, you need to be reevaluated immediately.  Miscarriage A miscarriage is the sudden loss of an unborn baby (fetus) before the 20th week of pregnancy. Most miscarriages happen in the first 3 months of pregnancy. Sometimes, it happens before a woman even knows she is pregnant. A miscarriage is also called a "spontaneous miscarriage" or "early pregnancy loss." Having a miscarriage can be an emotional experience. Talk with your caregiver about any questions you may have about miscarrying, the grieving process, and your future pregnancy plans. CAUSES   Problems with the fetal chromosomes that make it impossible for the baby to develop normally. Problems with the baby's genes or chromosomes are most often the result of errors that occur, by chance, as the embryo divides and grows. The problems are not inherited from the parents.  Infection of the cervix or uterus.   Hormone problems.   Problems with the cervix, such as having an incompetent cervix. This is when the tissue in the cervix is not strong enough to hold the pregnancy.   Problems with the uterus, such as an abnormally shaped uterus, uterine fibroids, or congenital abnormalities.   Certain medical conditions.   Smoking, drinking alcohol, or taking illegal drugs.   Trauma.  Often, the cause of a miscarriage is unknown.  SYMPTOMS   Vaginal bleeding or spotting, with or without cramps or pain.  Pain or cramping in the abdomen or lower back.  Passing fluid, tissue, or blood clots from the vagina. DIAGNOSIS  Your caregiver  will perform a physical exam. You may also have an ultrasound to confirm the miscarriage. Blood or urine tests may also be ordered. TREATMENT   Sometimes, treatment is not necessary if you naturally pass all the fetal tissue that was in the uterus. If some of the fetus or placenta remains in the body (incomplete miscarriage), tissue left behind may become infected and must be removed. Usually, a dilation and curettage (D and C) procedure is performed. During a D and C procedure, the cervix is widened (dilated) and any remaining fetal or placental tissue is gently removed from the uterus.  Antibiotic medicines are prescribed if there is an infection. Other medicines may be given to reduce the size of the uterus (contract) if there is a lot of bleeding.  If you have Rh negative blood and your baby was Rh positive, you will need a Rh immunoglobulin shot. This shot will protect any future baby from having Rh blood problems in future pregnancies. HOME CARE INSTRUCTIONS   Your caregiver may order bed rest or may allow you to continue light activity. Resume activity as directed by your caregiver.  Have someone help with home and family responsibilities during this time.   Keep track of the number of sanitary pads you use each day and how soaked (saturated) they are. Write down this information.   Do not use tampons. Do not douche or have sexual intercourse until approved by your caregiver.   Only take over-the-counter or prescription medicines for pain or discomfort as directed by your caregiver.   Do not take aspirin. Aspirin can cause  bleeding.   Keep all follow-up appointments with your caregiver.   If you or your partner have problems with grieving, talk to your caregiver or seek counseling to help cope with the pregnancy loss. Allow enough time to grieve before trying to get pregnant again.  SEEK IMMEDIATE MEDICAL CARE IF:   You have severe cramps or pain in your back or  abdomen.  You have a fever.  You pass large blood clots (walnut-sized or larger) ortissue from your vagina. Save any tissue for your caregiver to inspect.   Your bleeding increases.   You have a thick, bad-smelling vaginal discharge.  You become lightheaded, weak, or you faint.   You have chills.  MAKE SURE YOU:  Understand these instructions.  Will watch your condition.  Will get help right away if you are not doing well or get worse.   This information is not intended to replace advice given to you by your health care provider. Make sure you discuss any questions you have with your health care provider.   Document Released: 09/15/2000 Document Revised: 07/17/2012 Document Reviewed: 05/11/2011 Elsevier Interactive Patient Education [employer]

## 2015-06-19 ENCOUNTER — Inpatient Hospital Stay (HOSPITAL_COMMUNITY)
Admission: AD | Admit: 2015-06-19 | Discharge: 2015-06-19 | Disposition: A | Discharge status: Home / Self Care | Payer: PRIVATE HEALTH INSURANCE | Source: Ambulatory Visit | Attending: Obstetrics and Gynecology | Admitting: Obstetrics and Gynecology

## 2015-06-19 ENCOUNTER — Encounter (HOSPITAL_COMMUNITY): Payer: Self-pay | Admitting: *Deleted

## 2015-06-19 DIAGNOSIS — R109 Unspecified abdominal pain: Secondary | ICD-10-CM

## 2015-06-19 DIAGNOSIS — N939 Abnormal uterine and vaginal bleeding, unspecified: Secondary | ICD-10-CM

## 2015-06-19 DIAGNOSIS — O200 Threatened abortion: Secondary | ICD-10-CM

## 2015-06-19 DIAGNOSIS — O26899 Other specified pregnancy related conditions, unspecified trimester: Secondary | ICD-10-CM

## 2015-06-19 DIAGNOSIS — F17210 Nicotine dependence, cigarettes, uncomplicated: Secondary | ICD-10-CM | POA: Insufficient documentation

## 2015-06-19 DIAGNOSIS — O99331 Smoking (tobacco) complicating pregnancy, first trimester: Secondary | ICD-10-CM | POA: Diagnosis not present

## 2015-06-19 DIAGNOSIS — Z3A01 Less than 8 weeks gestation of pregnancy: Secondary | ICD-10-CM | POA: Insufficient documentation

## 2015-06-19 LAB — CBC
HEMATOCRIT: 33.3 % — AB (ref 36.0–46.0)
HEMOGLOBIN: 11.1 g/dL — AB (ref 12.0–15.0)
MCH: 27.5 pg (ref 26.0–34.0)
MCHC: 33.3 g/dL (ref 30.0–36.0)
MCV: 82.6 fL (ref 78.0–100.0)
PLATELETS: 291 10*3/uL (ref 150–400)
RBC: 4.03 MIL/uL (ref 3.87–5.11)
RDW: 14.5 % (ref 11.5–15.5)
WBC: 7.3 10*3/uL (ref 4.0–10.5)

## 2015-06-19 LAB — HCG, QUANTITATIVE, PREGNANCY: hCG, Beta Chain, Quant, S: 58637 m[IU]/mL — ABNORMAL HIGH (ref <5)

## 2015-06-19 MED ORDER — OXYCODONE-ACETAMINOPHEN 5-325 MG PO TABS: 1.0000 | ORAL_TABLET | Freq: Once | ORAL | Status: DC

## 2015-06-19 MED ORDER — HYDROCODONE-ACETAMINOPHEN 5-325 MG PO TABS: 1.0000 | ORAL_TABLET | ORAL | Status: DC | PRN

## 2015-06-19 MED ORDER — KETOROLAC TROMETHAMINE 60 MG/2ML IM SOLN
60.0000 mg | Freq: Once | INTRAMUSCULAR | Status: AC
  Administered 2015-06-19: 60 mg via INTRAMUSCULAR
  Filled 2015-06-19: qty 2

## 2015-06-19 NOTE — MAU Note (Signed)
Pt reports she was at the ED 2 days ago. Told she may be having a miscarriage. Stated she is changin her panty liners every 30 min. Pain has increased as well.

## 2015-06-19 NOTE — Discharge Instructions (Signed)

## 2015-06-19 NOTE — MAU Provider Note (Signed)
History     CSN: [id number]  Arrival date and time: 06/19/15 0909   First Provider Initiated Contact with Patient 06/19/15 [phone]      Chief Complaint  Patient presents with  . Vaginal Bleeding  . Abdominal Pain   HPI  Ms.Murrell Converseoneka Niemeier is a 25 y.o. female G1P0 @ 7530w2d presenting to MAU for vaginal bleeding and continued abdominal pain. The patient was seen at [facility] 2 days ago with vaginal bleeding in pregnancy.  [place] confirmed IUP with yolk sac, however also showed a large subchorionic hemorrhage and irregular sac; beta hcg level 2 days ago was 44,000. The patient was sent home with "threatened miscarriage precautions" and was told to follow up with OBGYN today.   The bleeding is much less and the pain remains the same. The pain is located in the lower part of her abdomen; both sides.    OB History    Gravida Para Term Preterm AB TAB SAB Ectopic Multiple Living   1               Past Medical History  Diagnosis Date  . Alcohol abuse     Depression  . Chickenpox   . Depression   . Eating disorder     Bulemia, resolved  . Frequent headaches   . UTI (lower urinary tract infection)     Past Surgical History  Procedure Laterality Date  . Root canal      Family History  Problem Relation Age of Onset  . Hypertension Mother     Living  . Hypertension Father     Living  . Mental illness Mother   . Depression Mother   . Alcoholism Maternal Grandmother   . Kidney Stones Father   . Sleep apnea Father   . Prostate cancer Paternal Grandfather     Deceased  . Hypertension Maternal Grandmother   . Hypertension Paternal Grandmother   . Arthritis Maternal Grandmother   . Alzheimer's disease Other     Maternal Great Aunt  . Breast cancer Maternal Aunt 50  . HIV Brother   . Mental illness Brother     Possible Bipolar #1  . Healthy Sister     Half-sister    Social History  Substance Use Topics  . Smoking status: Current Some Day Smoker -- 0.00 packs/day for 1  years    Types: Cigarettes, Cigars  . Smokeless tobacco: Never Used  . Alcohol Use: Yes    Allergies:  Allergies  Allergen Reactions  . Citrus Itching and Nausea And Vomiting    Citrus fruits, Strawberry, Watermelon  . Latex Itching and Rash    Prescriptions prior to admission  Medication Sig Dispense Refill Last Dose  . sertraline (ZOLOFT) 50 MG tablet Take 1/2 tablet by mouth daily x 7 days. Then increase to 1 tablet daily. (Patient not taking: Reported on 06/17/2015) 30 tablet 1 Not Taking at Unknown time    Results for orders placed or performed during the hospital encounter of 06/19/15 (from the past 48 hour(s))  CBC     Status: Abnormal   Collection Time: 06/19/15  9:34 AM  Result Value Ref Range   WBC 7.3 4.0 - 10.5 K/uL   RBC 4.03 3.87 - 5.11 MIL/uL   Hemoglobin 11.1 (L) 12.0 - 15.0 g/dL   HCT 57.833.3 (L) 46.936.0 - 62.946.0 %   MCV 82.6 78.0 - 100.0 fL   MCH 27.5 26.0 - 34.0 pg   MCHC 33.3 30.0 - 36.0  g/dL   RDW 16.1 09.6 - 04.5 %   Platelets 291 150 - 400 K/uL  hCG, quantitative, pregnancy     Status: Abnormal   Collection Time: 06/19/15  9:35 AM  Result Value Ref Range   hCG, Beta Chain, Quant, S 58637 (H) <5 mIU/mL    Comment:          GEST. AGE      CONC.  (mIU/mL)   <=1 WEEK        5 - 50     2 WEEKS       50 - 500     3 WEEKS       100 - 10,000     4 WEEKS     1,000 - 30,000     5 WEEKS     3,500 - 115,000   6-8 WEEKS     12,000 - 270,000    12 WEEKS     15,000 - 220,000        FEMALE AND NON-PREGNANT FEMALE:     LESS THAN 5 mIU/mL     Review of Systems  Constitutional: Negative for fever and chills.  Gastrointestinal: Positive for abdominal pain. Negative for nausea, vomiting, diarrhea and constipation.   Physical Exam   Blood pressure 113/72, pulse 91, temperature 98.7 F (37.1 C), temperature source Oral, resp. rate 18, last menstrual period 05/06/2015.  Physical Exam  Constitutional: She is oriented to person, place, and time. She appears  well-developed and well-nourished. No distress.  HENT:  Head: Normocephalic.  Eyes: Pupils are equal, round, and reactive to light.  Neck: Neck supple.  GI: Soft. She exhibits no distension. There is no tenderness.  Genitourinary:  Pink discharge noted on the exam glove. Small amount.   Neurological: She is alert and oriented to person, place, and time.  Skin: Skin is warm. She is not diaphoretic.  Psychiatric: She has a normal mood and affect.    MAU Course  Procedures  None  MDM  [place] images reviewed.  Ultrasound images show images of a yolk sac.  A positive blood type Toradol 60 mg IM given; patient states her pain is 0/10    Assessment and Plan   A:  1. Threatened miscarriage   2. Abdominal pain in pregnancy   3. Vaginal spotting     P:  Discharge home in stable condition  Repeat US in 7-10 days; patient to follow up in the [facility] following [place] Bleeding precautions Ok to take tylenol for pain as needed RX: Vicodin for worsening pain; do not take in addition to tylenol over the counter.  Miscarriage precautions.    Duane Lope, NP 06/19/2015 2:14 PM

## 2015-06-26 ENCOUNTER — Encounter (HOSPITAL_COMMUNITY): Payer: Self-pay | Admitting: Medical

## 2015-06-26 ENCOUNTER — Ambulatory Visit (HOSPITAL_COMMUNITY)
Admission: RE | Admit: 2015-06-26 | Discharge: 2015-06-26 | Disposition: A | Discharge status: Home / Self Care | Payer: Medicaid Other | Source: Ambulatory Visit | Attending: Obstetrics and Gynecology | Admitting: Obstetrics and Gynecology

## 2015-06-26 ENCOUNTER — Inpatient Hospital Stay (HOSPITAL_COMMUNITY)
Admission: AD | Admit: 2015-06-26 | Discharge: 2015-06-26 | Disposition: A | Discharge status: Home / Self Care | Payer: PRIVATE HEALTH INSURANCE | Source: Ambulatory Visit | Attending: Obstetrics and Gynecology | Admitting: Obstetrics and Gynecology

## 2015-06-26 DIAGNOSIS — Z3490 Encounter for supervision of normal pregnancy, unspecified, unspecified trimester: Secondary | ICD-10-CM

## 2015-06-26 DIAGNOSIS — N939 Abnormal uterine and vaginal bleeding, unspecified: Secondary | ICD-10-CM

## 2015-06-26 DIAGNOSIS — R42 Dizziness and giddiness: Secondary | ICD-10-CM | POA: Diagnosis not present

## 2015-06-26 DIAGNOSIS — O468X1 Other antepartum hemorrhage, first trimester: Secondary | ICD-10-CM

## 2015-06-26 DIAGNOSIS — O200 Threatened abortion: Secondary | ICD-10-CM | POA: Diagnosis present

## 2015-06-26 DIAGNOSIS — O209 Hemorrhage in early pregnancy, unspecified: Secondary | ICD-10-CM | POA: Insufficient documentation

## 2015-06-26 DIAGNOSIS — O26891 Other specified pregnancy related conditions, first trimester: Secondary | ICD-10-CM | POA: Insufficient documentation

## 2015-06-26 DIAGNOSIS — O26899 Other specified pregnancy related conditions, unspecified trimester: Secondary | ICD-10-CM

## 2015-06-26 DIAGNOSIS — O418X10 Other specified disorders of amniotic fluid and membranes, first trimester, not applicable or unspecified: Secondary | ICD-10-CM

## 2015-06-26 DIAGNOSIS — Z3A01 Less than 8 weeks gestation of pregnancy: Secondary | ICD-10-CM | POA: Insufficient documentation

## 2015-06-26 DIAGNOSIS — R109 Unspecified abdominal pain: Secondary | ICD-10-CM

## 2015-06-26 NOTE — MAU Provider Note (Signed)
Julia Webb is a 25 y.o. G1P0 at 6134w2d who presents to MAU today for follow-up US results. She denies abdominal pain, vaginal bleeding, N/V or fever today.   LMP 05/06/2015  GENERAL: Well-developed, well-nourished female in no acute distress.  HEENT: Normocephalic, atraumatic.   LUNGS: Effort normal HEART: Regular rate  SKIN: Warm, dry and without erythema PSYCH: Normal mood and affect  [place] Ob Transvaginal  06/26/2015  CLINICAL DATA:  25 year old pregnant female with vaginal bleeding and threatened miscarriage. Abdominal pain. Intrauterine gestational sac with yolk sac and no embryo on recent obstetric scan. EDC by LMP: 02/10/2016, projecting to an expected gestational age of [redacted] weeks 2 days. EXAM: TRANSVAGINAL OB ULTRASOUND TECHNIQUE: Transvaginal ultrasound was performed for complete evaluation of the gestation as well as the maternal uterus, adnexal regions, and pelvic cul-de-sac. COMPARISON:  06/17/2015 obstetric scan. FINDINGS: Intrauterine gestational sac: Single intrauterine gestational sac appears normal in size, shape and position. Yolk sac:  Present. Embryo:  Present. Embryonic Cardiac Activity: Regular rate and rhythm. Heart Rate: 120 bpm CRL:   7.6  mm   6 w 6 d                  US EDC: 02/13/2016 Subchorionic hemorrhage: Questionable tiny perigestational bleed in the right endometrial cavity involving less than 30% of the gestational sac circumference. Maternal uterus/adnexae: Right ovary measures 4.1 x 2.3 x 2.6 cm. Left ovary measures 4.0 x 2.5 x 3.7 cm. There is a 2.1 cm corpus luteum in the left ovary. No suspicious ovarian or adnexal masses. No abnormal free fluid in the pelvis. No uterine fibroids. IMPRESSION: 1. Single living intrauterine gestation at 6 weeks 6 days by crown-rump length, with no significant discrepancy with the expected gestational age of [redacted] weeks 2 days by provided menstrual dating. 2. Normal embryonic cardiac activity. 3. Questionable tiny perigestational bleed,  unlikely to be clinically significant. A follow-up obstetric scan could be obtained in 4 weeks to assess fetal growth as clinically warranted. 4. No suspicious ovarian or adnexal findings. Electronically Signed   By: Delbert PhenixJason A Poff M.D.   On: 06/26/2015 16:29    A: SIUP at 2881w6d by US, will continue to use 6234w2d by LMP Intermittent dizziness, mild  P: Discharge home First trimester warning signs discussed Increased PO hydration, small frequent snacks and slow change of positions discussed for dizziness Patient advised to start prenatal care with provider of choice Pregnancy confirmation letter given with list of area OB providers Patient may return to MAU as needed or if her condition were to change or worsen   Marny LowensteinJulie N Wenzel, PA-C  06/26/2015 4:53 PM

## 2015-06-26 NOTE — Discharge Instructions (Signed)
First Trimester of Pregnancy °The first trimester of pregnancy is from week 1 until the end of week 12 (months 1 through 3). During this time, your baby will begin to develop inside you. At 6-8 weeks, the eyes and face are formed, and the heartbeat can be seen on ultrasound. At the end of 12 weeks, all the baby's organs are formed. Prenatal care is all the medical care you receive before the birth of your baby. Make sure you get good prenatal care and follow all of your doctor's instructions. °HOME CARE  °Medicines °· Take medicine only as told by your doctor. Some medicines are safe and some are not during pregnancy. °· Take your prenatal vitamins as told by your doctor. °· Take medicine that helps you poop (stool softener) as needed if your doctor says it is okay. °Diet °· Eat regular, healthy meals. °· Your doctor will tell you the amount of weight gain that is right for you. °· Avoid raw meat and uncooked cheese. °· If you feel sick to your stomach (nauseous) or throw up (vomit): °· Eat 4 or 5 small meals a day instead of 3 large meals. °· Try eating a few soda crackers. °· Drink liquids between meals instead of during meals. °· If you have a hard time pooping (constipation): °· Eat high-fiber foods like fresh vegetables, fruit, and whole grains. °· Drink enough fluids to keep your pee (urine) clear or pale yellow. °Activity and Exercise °· Exercise only as told by your doctor. Stop exercising if you have cramps or pain in your lower belly (abdomen) or low back. °· Try to avoid standing for long periods of time. Move your legs often if you must stand in one place for a long time. °· Avoid heavy lifting. °· Wear low-heeled shoes. Sit and stand up straight. °· You can have sex unless your doctor tells you not to. °Relief of Pain or Discomfort °· Wear a good support bra if your breasts are sore. °· Take warm water baths (sitz baths) to soothe pain or discomfort caused by hemorrhoids. Use hemorrhoid cream if your  doctor says it is okay. °· Rest with your legs raised if you have leg cramps or low back pain. °· Wear support hose if you have puffy, bulging veins (varicose veins) in your legs. Raise (elevate) your feet for 15 minutes, 3-4 times a day. Limit salt in your diet. °Prenatal Care °· Schedule your prenatal visits by the twelfth week of pregnancy. °· Write down your questions. Take them to your prenatal visits. °· Keep all your prenatal visits as told by your doctor. °Safety °· Wear your seat belt at all times when driving. °· Make a list of emergency phone numbers. The list should include numbers for family, friends, the hospital, and police and fire departments. °General Tips °· Ask your doctor for a referral to a local prenatal class. Begin classes no later than at the start of month 6 of your pregnancy. °· Ask for help if you need counseling or help with nutrition. Your doctor can give you advice or tell you where to go for help. °· Do not use hot tubs, steam rooms, or saunas. °· Do not douche or use tampons or scented sanitary pads. °· Do not cross your legs for long periods of time. °· Avoid litter boxes and soil used by cats. °· Avoid all smoking, herbs, and alcohol. Avoid drugs not approved by your doctor. °· Do not use any tobacco products, including cigarettes,   chewing tobacco, and electronic cigarettes. If you need help quitting, ask your doctor. You may get counseling or other support to help you quit.  Visit your dentist. At home, brush your teeth with a soft toothbrush. Be gentle when you floss. GET HELP IF:  You are dizzy.  You have mild cramps or pressure in your lower belly.  You have a nagging pain in your belly area.  You continue to feel sick to your stomach, throw up, or have watery poop (diarrhea).  You have a bad smelling fluid coming from your vagina.  You have pain with peeing (urination).  You have increased puffiness (swelling) in your face, hands, legs, or ankles. GET HELP  RIGHT AWAY IF:   You have a fever.  You are leaking fluid from your vagina.  You have spotting or bleeding from your vagina.  You have very bad belly cramping or pain.  You gain or lose weight rapidly.  You throw up blood. It may look like coffee grounds.  You are around people who have [place] measles, fifth disease, or chickenpox.  You have a very bad headache.  You have shortness of breath.  You have any kind of trauma, such as from a fall or a car accident.   This information is not intended to replace advice given to you by your health care provider. Make sure you discuss any questions you have with your health care provider.   Document Released: 09/08/2007 Document Revised: 04/12/2014 Document Reviewed: 01/30/2013 Elsevier Interactive Patient Education 2016 [employer] Inc. Subchorionic Hematoma A subchorionic hematoma is a gathering of blood between the outer wall of the placenta and the inner wall of the womb (uterus). The placenta is the organ that connects the fetus to the wall of the uterus. The placenta performs the feeding, breathing (oxygen to the fetus), and waste removal (excretory work) of the fetus.  Subchorionic hematoma is the most common abnormality found on a result from ultrasonography done during the first trimester or early second trimester of pregnancy. If there has been little or no vaginal bleeding, early small hematomas usually shrink on their own and do not affect your baby or pregnancy. The blood is gradually absorbed over 1-2 weeks. When bleeding starts later in pregnancy or the hematoma is larger or occurs in an older pregnant woman, the outcome may not be as good. Larger hematomas may get bigger, which increases the chances for miscarriage. Subchorionic hematoma also increases the risk of premature detachment of the placenta from the uterus, preterm (premature) labor, and stillbirth. HOME CARE INSTRUCTIONS  Stay on bed rest if your health care provider  recommends this. Although bed rest will not prevent more bleeding or prevent a miscarriage, your health care provider may recommend bed rest until you are advised otherwise.  Avoid heavy lifting (more than 10 lb [4.5 kg]), exercise, sexual intercourse, or douching as directed by your health care provider.  Keep track of the number of pads you use each day and how soaked (saturated) they are. Write down this information.  Do not use tampons.  Keep all follow-up appointments as directed by your health care provider. Your health care provider may ask you to have follow-up blood tests or ultrasound tests or both. SEEK IMMEDIATE MEDICAL CARE IF:  You have severe cramps in your stomach, back, abdomen, or pelvis.  You have a fever.  You pass large clots or tissue. Save any tissue for your health care provider to look at.  Your bleeding increases or  you become lightheaded, feel weak, or have fainting episodes.   This information is not intended to replace advice given to you by your health care provider. Make sure you discuss any questions you have with your health care provider.   Document Released: 07/07/2006 Document Revised: 04/12/2014 Document Reviewed: 10/19/2012 Elsevier Interactive Patient Education [employer]

## 2015-07-06 ENCOUNTER — Encounter (HOSPITAL_COMMUNITY): Payer: Self-pay | Admitting: *Deleted

## 2015-07-06 ENCOUNTER — Inpatient Hospital Stay (HOSPITAL_COMMUNITY)
Admission: AD | Admit: 2015-07-06 | Discharge: 2015-07-06 | Disposition: A | Discharge status: Home / Self Care | Payer: Medicaid Other | Source: Ambulatory Visit | Attending: Obstetrics and Gynecology | Admitting: Obstetrics and Gynecology

## 2015-07-06 DIAGNOSIS — A499 Bacterial infection, unspecified: Secondary | ICD-10-CM

## 2015-07-06 DIAGNOSIS — Z87891 Personal history of nicotine dependence: Secondary | ICD-10-CM | POA: Diagnosis not present

## 2015-07-06 DIAGNOSIS — Z3A09 9 weeks gestation of pregnancy: Secondary | ICD-10-CM

## 2015-07-06 DIAGNOSIS — N760 Acute vaginitis: Secondary | ICD-10-CM | POA: Diagnosis not present

## 2015-07-06 DIAGNOSIS — Z3A08 8 weeks gestation of pregnancy: Secondary | ICD-10-CM | POA: Diagnosis not present

## 2015-07-06 DIAGNOSIS — O23591 Infection of other part of genital tract in pregnancy, first trimester: Secondary | ICD-10-CM | POA: Insufficient documentation

## 2015-07-06 DIAGNOSIS — O209 Hemorrhage in early pregnancy, unspecified: Secondary | ICD-10-CM | POA: Insufficient documentation

## 2015-07-06 DIAGNOSIS — O468X1 Other antepartum hemorrhage, first trimester: Secondary | ICD-10-CM

## 2015-07-06 DIAGNOSIS — O418X10 Other specified disorders of amniotic fluid and membranes, first trimester, not applicable or unspecified: Secondary | ICD-10-CM

## 2015-07-06 DIAGNOSIS — R1030 Lower abdominal pain, unspecified: Secondary | ICD-10-CM | POA: Diagnosis present

## 2015-07-06 DIAGNOSIS — B9689 Other specified bacterial agents as the cause of diseases classified elsewhere: Secondary | ICD-10-CM

## 2015-07-06 LAB — WET PREP, GENITAL
Sperm: NONE SEEN
Trich, Wet Prep: NONE SEEN
YEAST WET PREP: NONE SEEN

## 2015-07-06 LAB — URINALYSIS, ROUTINE W REFLEX MICROSCOPIC
Bilirubin Urine: NEGATIVE
GLUCOSE, UA: NEGATIVE mg/dL
KETONES UR: NEGATIVE mg/dL
LEUKOCYTES UA: NEGATIVE
Nitrite: NEGATIVE
PROTEIN: 100 mg/dL — AB
Specific Gravity, Urine: >1.03 — ABNORMAL HIGH (ref 1.005–1.030)
pH: 6 (ref 5.0–8.0)

## 2015-07-06 LAB — URINE MICROSCOPIC-ADD ON

## 2015-07-06 MED ORDER — METRONIDAZOLE 0.75 % VA GEL: 1.0000 | Freq: Every day | VAGINAL | Status: DC

## 2015-07-06 NOTE — MAU Provider Note (Signed)
History     CSN: [id number]  Arrival date and time: 07/06/15 1191   First Provider Initiated Contact with Patient 07/06/15 1014      Chief Complaint  Patient presents with  . Vaginal Bleeding  . Abdominal Pain   HPI Julia Webb 24 y.o. [redacted]w[redacted]d Comes to MAU as she had brown blood this morning when she got up.  Has had periodic lower abdominal pain and the same pain earlier this morning.  At this point is not in pain.  Has not taken anything for pain.  Reports last intercourse was in February.  GC/Chlm were negative at ER visit 2 weeks ago.  Has had an ultrasound with FHT and subchorionic hemorrhage noted.  OB History    Gravida Para Term Preterm AB TAB SAB Ectopic Multiple Living   1               Past Medical History  Diagnosis Date  . Alcohol abuse     Depression  . Chickenpox   . Depression   . Eating disorder     Bulemia, resolved  . Frequent headaches   . UTI (lower urinary tract infection)     Past Surgical History  Procedure Laterality Date  . Root canal      Family History  Problem Relation Age of Onset  . Hypertension Mother     Living  . Hypertension Father     Living  . Mental illness Mother   . Depression Mother   . Alcoholism Maternal Grandmother   . Kidney Stones Father   . Sleep apnea Father   . Prostate cancer Paternal Grandfather     Deceased  . Hypertension Maternal Grandmother   . Hypertension Paternal Grandmother   . Arthritis Maternal Grandmother   . Alzheimer's disease Other     Maternal Great Aunt  . Breast cancer Maternal Aunt 50  . HIV Brother   . Mental illness Brother     Possible Bipolar #1  . Healthy Sister     Half-sister    Social History  Substance Use Topics  . Smoking status: Former Smoker -- 0.00 packs/day for 1 years    Types: Cigars  . Smokeless tobacco: Never Used  . Alcohol Use: No    Allergies:  Allergies  Allergen Reactions  . Citrus Itching and Nausea And Vomiting    Citrus fruits, Strawberry,  Watermelon  . Latex Itching and Rash    Prescriptions prior to admission  Medication Sig Dispense Refill Last Dose  . HYDROcodone-acetaminophen (NORCO/VICODIN) 5-325 MG tablet Take 1-2 tablets by mouth every 4 (four) hours as needed. (Patient taking differently: Take 1-2 tablets by mouth every 4 (four) hours as needed for moderate pain. ) 10 tablet 0 Past Week at Unknown time  . sertraline (ZOLOFT) 50 MG tablet Take 1/2 tablet by mouth daily x 7 days. Then increase to 1 tablet daily. 30 tablet 1 Past Month at Unknown time    Review of Systems  Gastrointestinal: Positive for nausea and abdominal pain. Negative for vomiting, diarrhea and constipation.  Genitourinary:       No vaginal discharge. Vaginal bleeding. No dysuria.   Physical Exam   Blood pressure 109/78, pulse 95, temperature 97.9 F (36.6 C), resp. rate 16, height  (1.626 m), weight 127 lb (57.607 kg), last menstrual period 05/06/2015.  Physical Exam  Nursing note and vitals reviewed. Constitutional: She is oriented to person, place, and time. She appears well-developed and well-nourished.  HENT:  Head:  Normocephalic.  Eyes: EOM are normal.  Neck: Neck supple.  GI: Soft. There is tenderness. There is no rebound and no guarding.  Genitourinary:  Speculum exam: Vagina - Mod amount of creamy discharge, no odor Cervix - No contact bleeding, no blood seen Bimanual exam: Cervix closed Uterus mildly tender, normal size Adnexa tender on left side, no masses bilaterally wet prep done Chaperone present for exam.  Musculoskeletal: Normal range of motion.  Neurological: She is alert and oriented to person, place, and time.  Skin: Skin is warm and dry.  Psychiatric: She has a normal mood and affect.    MAU Course  Procedures Results for orders placed or performed during the hospital encounter of 07/06/15 (from the past 24 hour(s))  Urinalysis, Routine w reflex microscopic (not at [facility])     Status: Abnormal    Collection Time: 07/06/15  9:45 AM  Result Value Ref Range   Color, Urine YELLOW YELLOW   APPearance HAZY (A) CLEAR   Specific Gravity, Urine >1.030 (H) 1.005 - 1.030   pH 6.0 5.0 - 8.0   Glucose, UA NEGATIVE NEGATIVE mg/dL   Hgb urine dipstick TRACE (A) NEGATIVE   Bilirubin Urine NEGATIVE NEGATIVE   Ketones, ur NEGATIVE NEGATIVE mg/dL   Protein, ur 161100 (A) NEGATIVE mg/dL   Nitrite NEGATIVE NEGATIVE   Leukocytes, UA NEGATIVE NEGATIVE  Urine microscopic-add on     Status: Abnormal   Collection Time: 07/06/15  9:45 AM  Result Value Ref Range   Squamous Epithelial / LPF 6-30 (A) NONE SEEN   WBC, UA 6-30 0 - 5 WBC/hpf   RBC / HPF 0-5 0 - 5 RBC/hpf   Bacteria, UA MANY (A) NONE SEEN   Casts HYALINE CASTS (A) NEGATIVE   Urine-Other MUCOUS PRESENT   Wet prep, genital     Status: Abnormal   Collection Time: 07/06/15 10:25 AM  Result Value Ref Range   Yeast Wet Prep HPF POC NONE SEEN NONE SEEN   Trich, Wet Prep NONE SEEN NONE SEEN   Clue Cells Wet Prep HPF POC PRESENT (A) NONE SEEN   WBC, Wet Prep HPF POC FEW (A) NONE SEEN   Sperm NONE SEEN     MDM Spoke at length with client about self care in pregnancy.  States she is tired and missing work but was driving home from a visit in [place] and arrived at 3 am.  Has not eaten anything since she got up this morning.  With no further bleeding, the bleeding she had is most likely coming from the previously identified subchorionic hemorrhage.  Encouraged her to begin prenatal care.  Encouraged small frequent meals, lots of PO fluids, adequate rest and walking as exercise to increase her heart rate.    Advised Tylenol PO by the package directions as needed.  Assessment and Plan  8 weeks with episode of vaginal bleeding (brown blood) that has now resolved. Blood type A positive Bacterial vaginosis  Plan No smoking, no drugs, no alcohol.   Take a prenatal vitamin one by mouth every day.   Eat small frequent snacks to avoid nausea.   Begin  prenatal care as soon as possible. Take Tylenol 325 mg 2 tablets by mouth every 4 hours if needed for pain. Drink at least 8 8-oz glasses of water every day. Will eprescribe Metrogel vaginal cream.   BURLESON,TERRI 07/06/2015, 10:40 AM

## 2015-07-06 NOTE — MAU Note (Signed)
Pain on and off for past few days.  Pain is sharp in nature at time but most time feels crampy.  Bleeding started last night dark reddish brownish color.  Last intercousre 2 months ago.  U/s vaginally one day this past week.

## 2015-07-06 NOTE — Discharge Instructions (Signed)
No smoking, no drugs, no alcohol.   Take a prenatal vitamin one by mouth every day.   Eat small frequent snacks to avoid nausea.   Begin prenatal care as soon as possible. Take Tylenol 325 mg 2 tablets by mouth every 4 hours if needed for pain. Drink at least 8 8-oz glasses of water every day. Will eprescribe Metrogel vaginal cream.

## 2015-07-10 ENCOUNTER — Ambulatory Visit (INDEPENDENT_AMBULATORY_CARE_PROVIDER_SITE_OTHER): Payer: PRIVATE HEALTH INSURANCE | Admitting: Family Medicine

## 2015-07-10 ENCOUNTER — Encounter: Payer: Self-pay | Admitting: Family Medicine

## 2015-07-10 VITALS — BP 113/69 | HR 64 | Temp 98.4°F | Wt 128.0 lb

## 2015-07-10 DIAGNOSIS — Z3401 Encounter for supervision of normal first pregnancy, first trimester: Secondary | ICD-10-CM | POA: Diagnosis not present

## 2015-07-10 DIAGNOSIS — O0970 Supervision of high risk pregnancy due to social problems, unspecified trimester: Secondary | ICD-10-CM | POA: Insufficient documentation

## 2015-07-10 DIAGNOSIS — Z124 Encounter for screening for malignant neoplasm of cervix: Secondary | ICD-10-CM

## 2015-07-10 LAB — POCT URINALYSIS DIP (DEVICE)
Bilirubin Urine: NEGATIVE
GLUCOSE, UA: NEGATIVE mg/dL
HGB URINE DIPSTICK: NEGATIVE
Ketones, ur: NEGATIVE mg/dL
LEUKOCYTES UA: NEGATIVE
NITRITE: NEGATIVE
PROTEIN: NEGATIVE mg/dL
SPECIFIC GRAVITY, URINE: 1.025 (ref 1.005–1.030)
UROBILINOGEN UA: 0.2 mg/dL (ref 0.0–1.0)
pH: 7 (ref 5.0–8.0)

## 2015-07-10 MED ORDER — METRONIDAZOLE 500 MG PO TABS: 500.0000 mg | ORAL_TABLET | Freq: Two times a day (BID) | ORAL | Status: DC

## 2015-07-10 NOTE — Progress Notes (Signed)
First Trimester Screen scheduled for 07/31/15 @ 1300.   Pt notified.

## 2015-07-10 NOTE — Patient Instructions (Signed)
First Trimester of Pregnancy The first trimester of pregnancy is from week 1 until the end of week 12 (months 1 through 3). A week after a sperm fertilizes an egg, the egg will implant on the wall of the uterus. This embryo will begin to develop into a baby. Genes from you and your partner are forming the baby. The female genes determine whether the baby is a boy or a girl. At 6-8 weeks, the eyes and face are formed, and the heartbeat can be seen on ultrasound. At the end of 12 weeks, all the baby's organs are formed.  Now that you are pregnant, you will want to do everything you can to have a healthy baby. Two of the most important things are to get good prenatal care and to follow your health care provider's instructions. Prenatal care is all the medical care you receive before the baby's birth. This care will help prevent, find, and treat any problems during the pregnancy and childbirth. BODY CHANGES Your body goes through many changes during pregnancy. The changes vary from woman to woman.   You may gain or lose a couple of pounds at first.  You may feel sick to your stomach (nauseous) and throw up (vomit). If the vomiting is uncontrollable, call your health care provider.  You may tire easily.  You may develop headaches that can be relieved by medicines approved by your health care provider.  You may urinate more often. Painful urination may mean you have a bladder infection.  You may develop heartburn as a result of your pregnancy.  You may develop constipation because certain hormones are causing the muscles that push waste through your intestines to slow down.  You may develop hemorrhoids or swollen, bulging veins (varicose veins).  Your breasts may begin to grow larger and become tender. Your nipples may stick out more, and the tissue that surrounds them (areola) may become darker.  Your gums may bleed and may be sensitive to brushing and flossing.  Dark spots or blotches (chloasma,  mask of pregnancy) may develop on your face. This will likely fade after the baby is born.  Your menstrual periods will stop.  You may have a loss of appetite.  You may develop cravings for certain kinds of food.  You may have changes in your emotions from day to day, such as being excited to be pregnant or being concerned that something may go wrong with the pregnancy and baby.  You may have more vivid and strange dreams.  You may have changes in your hair. These can include thickening of your hair, rapid growth, and changes in texture. Some women also have hair loss during or after pregnancy, or hair that feels dry or thin. Your hair will most likely return to normal after your baby is born. WHAT TO EXPECT AT YOUR PRENATAL VISITS During a routine prenatal visit:  You will be weighed to make sure you and the baby are growing normally.  Your blood pressure will be taken.  Your abdomen will be measured to track your baby's growth.  The fetal heartbeat will be listened to starting around week 10 or 12 of your pregnancy.  Test results from any previous visits will be discussed. Your health care provider may ask you:  How you are feeling.  If you are feeling the baby move.  If you have had any abnormal symptoms, such as leaking fluid, bleeding, severe headaches, or abdominal cramping.  If you are using any tobacco products,   including cigarettes, chewing tobacco, and electronic cigarettes.  If you have any questions. Other tests that may be performed during your first trimester include:  Blood tests to find your blood type and to check for the presence of any previous infections. They will also be used to check for low iron levels (anemia) and Rh antibodies. Later in the pregnancy, blood tests for diabetes will be done along with other tests if problems develop.  Urine tests to check for infections, diabetes, or protein in the urine.  An ultrasound to confirm the proper growth  and development of the baby.  An amniocentesis to check for possible genetic problems.  Fetal screens for spina bifida and Down syndrome.  You may need other tests to make sure you and the baby are doing well.  HIV (human immunodeficiency virus) testing. Routine prenatal testing includes screening for HIV, unless you choose not to have this test. HOME CARE INSTRUCTIONS  Medicines  Follow your health care provider's instructions regarding medicine use. Specific medicines may be either safe or unsafe to take during pregnancy.  Take your prenatal vitamins as directed.  If you develop constipation, try taking a stool softener if your health care provider approves. Diet  Eat regular, well-balanced meals. Choose a variety of foods, such as meat or vegetable-based protein, fish, milk and low-fat dairy products, vegetables, fruits, and whole grain breads and cereals. Your health care provider will help you determine the amount of weight gain that is right for you.  Avoid raw meat and uncooked cheese. These carry germs that can cause birth defects in the baby.  Eating four or five small meals rather than three large meals a day may help relieve nausea and vomiting. If you start to feel nauseous, eating a few soda crackers can be helpful. Drinking liquids between meals instead of during meals also seems to help nausea and vomiting.  If you develop constipation, eat more high-fiber foods, such as fresh vegetables or fruit and whole grains. Drink enough fluids to keep your urine clear or pale yellow. Activity and Exercise  Exercise only as directed by your health care provider. Exercising will help you:  Control your weight.  Stay in shape.  Be prepared for labor and delivery.  Experiencing pain or cramping in the lower abdomen or low back is a good sign that you should stop exercising. Check with your health care provider before continuing normal exercises.  Try to avoid standing for long  periods of time. Move your legs often if you must stand in one place for a long time.  Avoid heavy lifting.  Wear low-heeled shoes, and practice good posture.  You may continue to have sex unless your health care provider directs you otherwise. Relief of Pain or Discomfort  Wear a good support bra for breast tenderness.   Take warm sitz baths to soothe any pain or discomfort caused by hemorrhoids. Use hemorrhoid cream if your health care provider approves.   Rest with your legs elevated if you have leg cramps or low back pain.  If you develop varicose veins in your legs, wear support hose. Elevate your feet for 15 minutes, 3-4 times a day. Limit salt in your diet. Prenatal Care  Schedule your prenatal visits by the twelfth week of pregnancy. They are usually scheduled monthly at first, then more often in the last 2 months before delivery.  Write down your questions. Take them to your prenatal visits.  Keep all your prenatal visits as directed by your   health care provider. Safety  Wear your seat belt at all times when driving.  Make a list of emergency phone numbers, including numbers for family, friends, the hospital, and police and fire departments. General Tips  Ask your health care provider for a referral to a local prenatal education class. Begin classes no later than at the beginning of month 6 of your pregnancy.  Ask for help if you have counseling or nutritional needs during pregnancy. Your health care provider can offer advice or refer you to specialists for help with various needs.  Do not use hot tubs, steam rooms, or saunas.  Do not douche or use tampons or scented sanitary pads.  Do not cross your legs for long periods of time.  Avoid cat litter boxes and soil used by cats. These carry germs that can cause birth defects in the baby and possibly loss of the fetus by miscarriage or stillbirth.  Avoid all smoking, herbs, alcohol, and medicines not prescribed by  your health care provider. Chemicals in these affect the formation and growth of the baby.  Do not use any tobacco products, including cigarettes, chewing tobacco, and electronic cigarettes. If you need help quitting, ask your health care provider. You may receive counseling support and other resources to help you quit.  Schedule a dentist appointment. At home, brush your teeth with a soft toothbrush and be gentle when you floss. SEEK MEDICAL CARE IF:   You have dizziness.  You have mild pelvic cramps, pelvic pressure, or nagging pain in the abdominal area.  You have persistent nausea, vomiting, or diarrhea.  You have a bad smelling vaginal discharge.  You have pain with urination.  You notice increased swelling in your face, hands, legs, or ankles. SEEK IMMEDIATE MEDICAL CARE IF:   You have a fever.  You are leaking fluid from your vagina.  You have spotting or bleeding from your vagina.  You have severe abdominal cramping or pain.  You have rapid weight gain or loss.  You vomit blood or material that looks like coffee grounds.  You are exposed to German measles and have never had them.  You are exposed to fifth disease or chickenpox.  You develop a severe headache.  You have shortness of breath.  You have any kind of trauma, such as from a fall or a car accident.   This information is not intended to replace advice given to you by your health care provider. Make sure you discuss any questions you have with your health care provider.   Document Released: 03/16/2001 Document Revised: 04/12/2014 Document Reviewed: 01/30/2013 Elsevier Interactive Patient Education 2016 [employer]  
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
## 2015-07-15 ENCOUNTER — Encounter (HOSPITAL_COMMUNITY): Payer: Self-pay | Admitting: Family Medicine

## 2015-07-31 ENCOUNTER — Ambulatory Visit (HOSPITAL_COMMUNITY)
Admission: RE | Admit: 2015-07-31 | Discharge: 2015-07-31 | Disposition: A | Discharge status: Home / Self Care | Payer: Medicaid Other | Source: Ambulatory Visit | Attending: Family Medicine | Admitting: Family Medicine

## 2015-07-31 ENCOUNTER — Encounter (HOSPITAL_COMMUNITY): Payer: Self-pay

## 2015-07-31 DIAGNOSIS — Z3A12 12 weeks gestation of pregnancy: Secondary | ICD-10-CM | POA: Insufficient documentation

## 2015-07-31 DIAGNOSIS — Z3401 Encounter for supervision of normal first pregnancy, first trimester: Secondary | ICD-10-CM

## 2015-07-31 DIAGNOSIS — Z36 Encounter for antenatal screening of mother: Secondary | ICD-10-CM | POA: Diagnosis present

## 2015-08-07 ENCOUNTER — Other Ambulatory Visit (HOSPITAL_COMMUNITY): Payer: Self-pay

## 2015-08-09 ENCOUNTER — Inpatient Hospital Stay (HOSPITAL_COMMUNITY)
Admission: AD | Admit: 2015-08-09 | Discharge: 2015-08-09 | Disposition: A | Discharge status: Home / Self Care | Payer: Medicaid Other | Source: Ambulatory Visit | Attending: Obstetrics & Gynecology | Admitting: Obstetrics & Gynecology

## 2015-08-09 ENCOUNTER — Encounter (HOSPITAL_COMMUNITY): Payer: Self-pay | Admitting: *Deleted

## 2015-08-09 DIAGNOSIS — N760 Acute vaginitis: Secondary | ICD-10-CM | POA: Insufficient documentation

## 2015-08-09 DIAGNOSIS — Z3A13 13 weeks gestation of pregnancy: Secondary | ICD-10-CM | POA: Diagnosis not present

## 2015-08-09 DIAGNOSIS — N898 Other specified noninflammatory disorders of vagina: Secondary | ICD-10-CM

## 2015-08-09 DIAGNOSIS — O23591 Infection of other part of genital tract in pregnancy, first trimester: Secondary | ICD-10-CM | POA: Diagnosis not present

## 2015-08-09 DIAGNOSIS — Z87891 Personal history of nicotine dependence: Secondary | ICD-10-CM | POA: Diagnosis not present

## 2015-08-09 DIAGNOSIS — O26891 Other specified pregnancy related conditions, first trimester: Secondary | ICD-10-CM

## 2015-08-09 LAB — URINALYSIS, ROUTINE W REFLEX MICROSCOPIC
Bilirubin Urine: NEGATIVE
GLUCOSE, UA: NEGATIVE mg/dL
HGB URINE DIPSTICK: NEGATIVE
Ketones, ur: NEGATIVE mg/dL
LEUKOCYTES UA: NEGATIVE
Nitrite: NEGATIVE
PROTEIN: NEGATIVE mg/dL
Specific Gravity, Urine: 1.01 (ref 1.005–1.030)
pH: 5.5 (ref 5.0–8.0)

## 2015-08-09 LAB — WET PREP, GENITAL
Sperm: NONE SEEN
TRICH WET PREP: NONE SEEN
Yeast Wet Prep HPF POC: NONE SEEN

## 2015-08-09 MED ORDER — METRONIDAZOLE 0.75 % VA GEL: 1.0000 | Freq: Two times a day (BID) | VAGINAL | Status: AC

## 2015-08-09 MED ORDER — ACETAMINOPHEN 325 MG PO TABS
650.0000 mg | ORAL_TABLET | Freq: Once | ORAL | Status: AC
  Administered 2015-08-09: 650 mg via ORAL
  Filled 2015-08-09: qty 2

## 2015-08-09 NOTE — MAU Note (Signed)
Patient presents at [redacted] weeks gestation with c/o abdominal pain X 30 minutes. Denies bleeding but has a yellow discharge.

## 2015-08-09 NOTE — Discharge Instructions (Signed)
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
## 2015-08-11 LAB — GC/CHLAMYDIA PROBE AMP (~~LOC~~) NOT AT ARMC
Chlamydia: NEGATIVE
NEISSERIA GONORRHEA: NEGATIVE

## 2015-09-23 ENCOUNTER — Encounter: Payer: Self-pay | Admitting: Obstetrics

## 2015-09-23 ENCOUNTER — Ambulatory Visit (INDEPENDENT_AMBULATORY_CARE_PROVIDER_SITE_OTHER): Payer: Medicaid Other | Admitting: Obstetrics

## 2015-09-23 VITALS — BP 99/66 | HR 82 | Wt 136.0 lb

## 2015-09-23 DIAGNOSIS — Z3689 Encounter for other specified antenatal screening: Secondary | ICD-10-CM

## 2015-09-23 DIAGNOSIS — Z3402 Encounter for supervision of normal first pregnancy, second trimester: Secondary | ICD-10-CM | POA: Diagnosis not present

## 2015-09-23 LAB — POCT URINALYSIS DIPSTICK
BILIRUBIN UA: NEGATIVE
GLUCOSE UA: NEGATIVE
Ketones, UA: NEGATIVE
Leukocytes, UA: NEGATIVE
Nitrite, UA: NEGATIVE
Protein, UA: NEGATIVE
RBC UA: NEGATIVE
SPEC GRAV UA: 1.01
Urobilinogen, UA: NEGATIVE
pH, UA: 7

## 2015-09-23 NOTE — Progress Notes (Signed)
Subjective:    Julia Webb is being seen today for her first obstetrical visit.  This is not a planned pregnancy. She is at [redacted]w[redacted]d gestation. Her obstetrical history is significant for none. Relationship with FOB: significant other, living together. Patient does intend to breast feed. Pregnancy history fully reviewed.  The information documented in the HPI was reviewed and verified.  Menstrual History: OB History    Gravida Para Term Preterm AB TAB SAB Ectopic Multiple Living         Patient's last menstrual period was 05/06/2015 (exact date).    Past Medical History  Diagnosis Date  . Alcohol abuse     Depression  . Chickenpox   . Depression   . Eating disorder     Bulemia, resolved  . Frequent headaches   . UTI (lower urinary tract infection)     Past Surgical History  Procedure Laterality Date  . Root canal    . Root canal       (Not in a hospital admission) Allergies  Allergen Reactions  . Citrus Itching and Nausea And Vomiting    Citrus fruits, Strawberry, Watermelon  . Latex Itching and Rash    Social History  Substance Use Topics  . Smoking status: Former Smoker -- 0.00 packs/day for 1 years    Types: Cigars  . Smokeless tobacco: Never Used  . Alcohol Use: No    Family History  Problem Relation Age of Onset  . Hypertension Mother     Living  . Hypertension Father     Living  . Mental illness Mother   . Depression Mother   . Alcoholism Maternal Grandmother   . Kidney Stones Father   . Sleep apnea Father   . Prostate cancer Paternal Grandfather     Deceased  . Hypertension Maternal Grandmother   . Hypertension Paternal Grandmother   . Arthritis Maternal Grandmother   . Alzheimer's disease Other     Maternal Great Aunt  . Breast cancer Maternal Aunt 50  . HIV Brother   . Mental illness Brother     Possible Bipolar #1  . Healthy Sister     Half-sister     Review of Systems Constitutional: negative for weight  loss Gastrointestinal: negative for vomiting Genitourinary:negative for genital lesions and vaginal discharge and dysuria Musculoskeletal:negative for back pain Behavioral/Psych: negative for abusive relationship, depression, illegal drug usage and tobacco use    Objective:    BP 99/66 mmHg  Pulse 82  Wt 136 lb (61.689 kg)  LMP 05/06/2015 (Exact Date) General Appearance:    Alert, cooperative, no distress, appears stated age  Head:    Normocephalic, without obvious abnormality, atraumatic  Eyes:    PERRL, conjunctiva/corneas clear, EOM's intact, fundi    benign, both eyes  Ears:    Normal TM's and external ear canals, both ears  Nose:   Nares normal, septum midline, mucosa normal, no drainage    or sinus tenderness  Throat:   Lips, mucosa, and tongue normal; teeth and gums normal  Neck:   Supple, symmetrical, trachea midline, no adenopathy;    thyroid:  no enlargement/tenderness/nodules; no carotid   bruit or JVD  Back:     Symmetric, no curvature, ROM normal, no CVA tenderness  Lungs:     Clear to auscultation bilaterally, respirations unlabored  Chest Wall:    No tenderness or deformity   Heart:    Regular rate and rhythm, S1  and S2 normal, no murmur, rub   or gallop  Breast Exam:    No tenderness, masses, or nipple abnormality  Abdomen:     Soft, non-tender, bowel sounds active all four quadrants,    no masses, no organomegaly  Genitalia:    Normal female without lesion, discharge or tenderness  Extremities:   Extremities normal, atraumatic, no cyanosis or edema  Pulses:   2+ and symmetric all extremities  Skin:   Skin color, texture, turgor normal, no rashes or lesions  Lymph nodes:   Cervical, supraclavicular, and axillary nodes normal  Neurologic:   CNII-XII intact, normal strength, sensation and reflexes    throughout      Lab Review Urine pregnancy test Labs reviewed yes Radiologic studies reviewed no Assessment:    Pregnancy at 1053w0d weeks    Plan:       Prenatal vitamins.  Counseling provided regarding continued use of seat belts, cessation of alcohol consumption, smoking or use of illicit drugs; infection precautions i.e., influenza/TDAP immunizations, toxoplasmosis,CMV, parvovirus, listeria and varicella; workplace safety, exercise during pregnancy; routine dental care, safe medications, sexual activity, hot tubs, saunas, pools, travel, caffeine use, fish and methlymercury, potential toxins, hair treatments, varicose veins Weight gain recommendations per IOM guidelines reviewed: underweight/BMI< 18.5--> gain 28 - 40 lbs; normal weight/BMI 18.5 - 24.9--> gain 25 - 35 lbs; overweight/BMI 25 - 29.9--> gain 15 - 25 lbs; obese/BMI >30->gain  11 - 20 lbs Problem list reviewed and updated. FIRST/CF mutation testing/NIPT/QUAD SCREEN/fragile X/Ashkenazi Jewish population testing/Spinal muscular atrophy discussed: requested. Role of ultrasound in pregnancy discussed; fetal survey: requested. Amniocentesis discussed: not indicated. VBAC calculator score: VBAC consent form provided No orders of the defined types were placed in this encounter.   Orders Placed This Encounter  Procedures  . US MFM OB DETAIL +14 WK    Standing Status: Future     Number of Occurrences:      Standing Expiration Date: 11/22/2016    Order Specific Question:  Reason for Exam (SYMPTOM  OR DIAGNOSIS REQUIRED)    Answer:  anatomic survey    Order Specific Question:  Preferred imaging location?    Answer:  [facility]tras[facility]  . US MFM OB Transvaginal    Standing Status: Future     Number of Occurrences:      Standing Expiration Date: 11/22/2016    Order Specific Question:  Reason for Exam (SYMPTOM  OR DIAGNOSIS REQUIRED)    Answer:  anatomic survey    Order Specific Question:  Preferred imaging location?    Answer:  [facility]tras[facility]    Follow up in 2 weeks.

## 2015-09-26 ENCOUNTER — Other Ambulatory Visit: Payer: Self-pay | Admitting: Obstetrics

## 2015-09-26 ENCOUNTER — Ambulatory Visit (HOSPITAL_COMMUNITY)
Admission: RE | Admit: 2015-09-26 | Discharge: 2015-09-26 | Disposition: A | Discharge status: Home / Self Care | Payer: Medicaid Other | Source: Ambulatory Visit | Attending: Obstetrics | Admitting: Obstetrics

## 2015-09-26 ENCOUNTER — Inpatient Hospital Stay (HOSPITAL_COMMUNITY)
Admission: AD | Admit: 2015-09-26 | Discharge: 2015-09-26 | Discharge status: Absent without leave | Payer: PRIVATE HEALTH INSURANCE | Attending: Obstetrics | Admitting: Obstetrics

## 2015-09-26 DIAGNOSIS — Z3689 Encounter for other specified antenatal screening: Secondary | ICD-10-CM

## 2015-09-26 DIAGNOSIS — Z36 Encounter for antenatal screening of mother: Secondary | ICD-10-CM | POA: Diagnosis not present

## 2015-09-26 DIAGNOSIS — Z1389 Encounter for screening for other disorder: Secondary | ICD-10-CM

## 2015-09-26 DIAGNOSIS — Z3402 Encounter for supervision of normal first pregnancy, second trimester: Secondary | ICD-10-CM

## 2015-09-26 DIAGNOSIS — Z3A20 20 weeks gestation of pregnancy: Secondary | ICD-10-CM | POA: Diagnosis not present

## 2015-09-26 DIAGNOSIS — Z363 Encounter for antenatal screening for malformations: Secondary | ICD-10-CM

## 2015-10-08 ENCOUNTER — Ambulatory Visit (INDEPENDENT_AMBULATORY_CARE_PROVIDER_SITE_OTHER): Payer: Medicaid Other | Admitting: Certified Nurse Midwife

## 2015-10-08 VITALS — BP 97/72 | HR 91 | Wt 138.0 lb

## 2015-10-08 DIAGNOSIS — Z3402 Encounter for supervision of normal first pregnancy, second trimester: Secondary | ICD-10-CM

## 2015-10-08 DIAGNOSIS — F329 Major depressive disorder, single episode, unspecified: Secondary | ICD-10-CM

## 2015-10-08 DIAGNOSIS — O99342 Other mental disorders complicating pregnancy, second trimester: Secondary | ICD-10-CM

## 2015-10-08 DIAGNOSIS — F32A Depression, unspecified: Secondary | ICD-10-CM

## 2015-10-08 LAB — POCT URINALYSIS DIPSTICK
Bilirubin, UA: NEGATIVE
Blood, UA: NEGATIVE
Glucose, UA: NEGATIVE
Ketones, UA: NEGATIVE
LEUKOCYTES UA: NEGATIVE
NITRITE UA: NEGATIVE
SPEC GRAV UA: 1.025
UROBILINOGEN UA: NEGATIVE
pH, UA: 5

## 2015-10-08 MED ORDER — CITALOPRAM HYDROBROMIDE 20 MG PO TABS: 20.0000 mg | ORAL_TABLET | Freq: Every day | ORAL | Status: DC
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
## 2015-10-14 ENCOUNTER — Other Ambulatory Visit: Payer: Self-pay | Admitting: Certified Nurse Midwife

## 2015-10-14 LAB — 726778 7+ALC-UNBUND
Amphetamines, Urine: NEGATIVE ng/mL
Barbiturate Quant, Ur: NEGATIVE ng/mL
Benzodiazepine Quant, Ur: NEGATIVE ng/mL
Cannabinoid Quant, Ur: POSITIVE — AB
Cocaine (Metab.): NEGATIVE ng/mL
ETHANOL U, QUAN: NEGATIVE %
OPIATE QUANT UR: NEGATIVE ng/mL
PCP QUANT UR: NEGATIVE ng/mL

## 2015-10-15 ENCOUNTER — Emergency Department (HOSPITAL_COMMUNITY)
Admission: EM | Admit: 2015-10-15 | Discharge: 2015-10-16 | Disposition: A | Discharge status: Other Acute Inpatient Hospital | Payer: Medicaid Other | Attending: Emergency Medicine | Admitting: Emergency Medicine

## 2015-10-15 ENCOUNTER — Encounter (HOSPITAL_COMMUNITY): Payer: Self-pay | Admitting: Emergency Medicine

## 2015-10-15 DIAGNOSIS — Z79899 Other long term (current) drug therapy: Secondary | ICD-10-CM | POA: Diagnosis not present

## 2015-10-15 DIAGNOSIS — T40692A Poisoning by other narcotics, intentional self-harm, initial encounter: Secondary | ICD-10-CM | POA: Insufficient documentation

## 2015-10-15 DIAGNOSIS — T40602A Poisoning by unspecified narcotics, intentional self-harm, initial encounter: Secondary | ICD-10-CM

## 2015-10-15 DIAGNOSIS — Z87891 Personal history of nicotine dependence: Secondary | ICD-10-CM | POA: Diagnosis not present

## 2015-10-15 DIAGNOSIS — R45851 Suicidal ideations: Secondary | ICD-10-CM | POA: Diagnosis not present

## 2015-10-15 DIAGNOSIS — F332 Major depressive disorder, recurrent severe without psychotic features: Secondary | ICD-10-CM | POA: Diagnosis present

## 2015-10-15 DIAGNOSIS — F329 Major depressive disorder, single episode, unspecified: Secondary | ICD-10-CM | POA: Insufficient documentation

## 2015-10-15 DIAGNOSIS — F1290 Cannabis use, unspecified, uncomplicated: Secondary | ICD-10-CM | POA: Diagnosis not present

## 2015-10-15 DIAGNOSIS — F411 Generalized anxiety disorder: Secondary | ICD-10-CM | POA: Diagnosis present

## 2015-10-15 DIAGNOSIS — F322 Major depressive disorder, single episode, severe without psychotic features: Secondary | ICD-10-CM | POA: Diagnosis present

## 2015-10-15 LAB — COMPREHENSIVE METABOLIC PANEL
ALBUMIN: 3.3 g/dL — AB (ref 3.5–5.0)
ALT: 27 U/L (ref 14–54)
ANION GAP: 6 (ref 5–15)
AST: 21 U/L (ref 15–41)
Alkaline Phosphatase: 59 U/L (ref 38–126)
CHLORIDE: 104 mmol/L (ref 101–111)
CO2: 22 mmol/L (ref 22–32)
Calcium: 8.5 mg/dL — ABNORMAL LOW (ref 8.9–10.3)
Creatinine, Ser: 0.35 mg/dL — ABNORMAL LOW (ref 0.44–1.00)
GFR calc Af Amer: >60 mL/min (ref >60)
GFR calc non Af Amer: >60 mL/min (ref >60)
GLUCOSE: 72 mg/dL (ref 65–99)
POTASSIUM: 3.3 mmol/L — AB (ref 3.5–5.1)
SODIUM: 132 mmol/L — AB (ref 135–145)
Total Bilirubin: 0.5 mg/dL (ref 0.3–1.2)
Total Protein: 6.3 g/dL — ABNORMAL LOW (ref 6.5–8.1)

## 2015-10-15 LAB — RAPID URINE DRUG SCREEN, HOSP PERFORMED
Amphetamines: NOT DETECTED
Barbiturates: NOT DETECTED
Benzodiazepines: NOT DETECTED
Cocaine: NOT DETECTED
Opiates: POSITIVE — AB
Tetrahydrocannabinol: POSITIVE — AB

## 2015-10-15 LAB — CBC WITH DIFFERENTIAL/PLATELET
BASOS ABS: 0 10*3/uL (ref 0.0–0.1)
Basophils Relative: 0 %
EOS PCT: 1 %
Eosinophils Absolute: 0.1 10*3/uL (ref 0.0–0.7)
HEMATOCRIT: 29.8 % — AB (ref 36.0–46.0)
Hemoglobin: 10.1 g/dL — ABNORMAL LOW (ref 12.0–15.0)
LYMPHS ABS: 1.5 10*3/uL (ref 0.7–4.0)
LYMPHS PCT: 16 %
MCH: 29.3 pg (ref 26.0–34.0)
MCHC: 33.9 g/dL (ref 30.0–36.0)
MCV: 86.4 fL (ref 78.0–100.0)
MONO ABS: 0.7 10*3/uL (ref 0.1–1.0)
Monocytes Relative: 8 %
NEUTROS ABS: 7.1 10*3/uL (ref 1.7–7.7)
Neutrophils Relative %: 75 %
PLATELETS: 276 10*3/uL (ref 150–400)
RBC: 3.45 MIL/uL — AB (ref 3.87–5.11)
RDW: 13.4 % (ref 11.5–15.5)
WBC: 9.5 10*3/uL (ref 4.0–10.5)

## 2015-10-15 LAB — URINALYSIS, ROUTINE W REFLEX MICROSCOPIC
Bilirubin Urine: NEGATIVE
Glucose, UA: NEGATIVE mg/dL
Hgb urine dipstick: NEGATIVE
Ketones, ur: NEGATIVE mg/dL
Leukocytes, UA: NEGATIVE
Nitrite: NEGATIVE
Protein, ur: NEGATIVE mg/dL
Specific Gravity, Urine: 1.013 (ref 1.005–1.030)
pH: 7.5 (ref 5.0–8.0)

## 2015-10-15 LAB — SALICYLATE LEVEL: Salicylate Lvl: <4 mg/dL (ref 2.8–30.0)

## 2015-10-15 LAB — ACETAMINOPHEN LEVEL: Acetaminophen (Tylenol), Serum: 18 ug/mL (ref 10–30)

## 2015-10-15 LAB — CBG MONITORING, ED: Glucose-Capillary: 103 mg/dL — ABNORMAL HIGH (ref 65–99)

## 2015-10-15 MED ORDER — ONDANSETRON HCL 4 MG PO TABS: 4.0000 mg | ORAL_TABLET | Freq: Three times a day (TID) | ORAL | Status: DC | PRN

## 2015-10-15 MED ORDER — NICOTINE 21 MG/24HR TD PT24: 21.0000 mg | MEDICATED_PATCH | Freq: Every day | TRANSDERMAL | Status: DC | PRN

## 2015-10-15 MED ORDER — ZOLPIDEM TARTRATE 5 MG PO TABS: 5.0000 mg | ORAL_TABLET | Freq: Every evening | ORAL | Status: DC | PRN

## 2015-10-15 MED ORDER — IBUPROFEN 200 MG PO TABS: 600.0000 mg | ORAL_TABLET | Freq: Three times a day (TID) | ORAL | Status: DC | PRN

## 2015-10-15 MED ORDER — ACETAMINOPHEN 325 MG PO TABS: 650.0000 mg | ORAL_TABLET | ORAL | Status: DC | PRN

## 2015-10-15 MED ORDER — ALUM & MAG HYDROXIDE-SIMETH 200-200-20 MG/5ML PO SUSP: 30.0000 mL | ORAL | Status: DC | PRN

## 2015-10-15 NOTE — Progress Notes (Signed)
[facility] to [facility] for a G1P0 23.1 weeks, came in for OD, pt states baby moving well, denies any cramping, leaking or bleeding. Dopplerred FHR  141, called Dr Debroah LoopArnold, OB cleared. Pt has next prenatal appointment August 2nd.

## 2015-10-15 NOTE — ED Notes (Signed)
ConAgra Foodsashad Bostic, boyfriend, CANNOT visit patient.

## 2015-10-15 NOTE — ED Notes (Signed)
Delay in drawing blood due to TTS and [facility] at bedside
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
## 2015-10-15 NOTE — ED Notes (Signed)
Bed: WA03 Expected date:  Expected time:  Means of arrival:  Comments: EMS-23 weeks preg/took narcs
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
## 2015-10-15 NOTE — ED Notes (Signed)
Julia [name], mother, may be reached at [phone]

## 2015-10-15 NOTE — ED Notes (Addendum)
OB [facility] to call back.  Not sure if they are coming over.

## 2015-10-15 NOTE — ED Notes (Signed)
R[facility] called and said they would be here shortly.

## 2015-10-15 NOTE — ED Provider Notes (Signed)
CSN: [id number]     Arrival date & time 10/15/15  1402 History   First MD Initiated Contact with Patient 10/15/15 1407     Chief Complaint  Patient presents with  . Drug Overdose     (Consider location/radiation/quality/duration/timing/severity/associated sxs/prior Treatment) HPI   Julia Webb is a 25 y.o. female who presents for evaluation of suicide attempt, with 4 hydrocodone tablets. She states that she took these pills, about 1 hour prior to arrival, because she wanted to "go to sleep and not wake up". After that, she texted to several friends, and wrote a note saying goodbye. She's been depressed recently, despite restarting her Celexa. She is in second trimester pregnancy complicated only by morning sickness. She has been seeing her obstetrician regularly. She is currently living with her mother. She presents by EMS. She did not receive treatment, by them. She denies recent illnesses. There are no other known modifying factors.   Past Medical History  Diagnosis Date  . Alcohol abuse     Depression  . Chickenpox   . Depression   . Eating disorder     Bulemia, resolved  . Frequent headaches   . UTI (lower urinary tract infection)    Past Surgical History  Procedure Laterality Date  . Root canal    . Root canal     Family History  Problem Relation Age of Onset  . Hypertension Mother     Living  . Hypertension Father     Living  . Mental illness Mother   . Depression Mother   . Alcoholism Maternal Grandmother   . Kidney Stones Father   . Sleep apnea Father   . Prostate cancer Paternal Grandfather     Deceased  . Hypertension Maternal Grandmother   . Hypertension Paternal Grandmother   . Arthritis Maternal Grandmother   . Alzheimer's disease Other     Maternal Great Aunt  . Breast cancer Maternal Aunt 50  . HIV Brother   . Mental illness Brother     Possible Bipolar #1  . Healthy Sister     Half-sister   Social History  Substance Use Topics  . Smoking  status: Former Smoker -- 0.00 packs/day for 1 years    Types: Cigars  . Smokeless tobacco: Never Used  . Alcohol Use: No   OB History    Gravida Para Term Preterm AB TAB SAB Ectopic Multiple Living       Review of Systems  All other systems reviewed and are negative.     Allergies  Citrus and Latex  Home Medications   Prior to Admission medications   Medication Sig Start Date End Date Taking? Authorizing Provider  citalopram (CELEXA) 20 MG tablet Take 1 tablet (20 mg total) by mouth daily. 10/08/15  Yes Rachelle A Marjo Bicker, CNM  Prenatal Vit-Min-FA-Fish Oil ([employer] PRENATAL GUMMY) 0.4-113.5 MG CHEW Chew 1 capsule by mouth daily.   Yes Historical Provider, MD   BP 107/66 mmHg  Pulse 68  Temp(Src) 98.3 F (36.8 C) (Oral)  Resp 17  SpO2 100%  LMP 05/06/2015 (Exact Date) Physical Exam  Constitutional: She is oriented to person, place, and time. She appears well-developed and well-nourished.  HENT:  Head: Normocephalic and atraumatic.  Right Ear: External ear normal.  Left Ear: External ear normal.  Eyes: Conjunctivae and EOM are normal. Pupils are equal, round, and reactive to light.  Neck: Normal range of motion and phonation normal. Neck  supple.  Cardiovascular: Normal rate, regular rhythm and normal heart sounds.   Pulmonary/Chest: Effort normal and breath sounds normal. She exhibits no bony tenderness.  Abdominal: Soft. There is no tenderness. There is no guarding.  Gravid. No abdominal or uterine tenderness.  Musculoskeletal: Normal range of motion.  Neurological: She is alert and oriented to person, place, and time. No cranial nerve deficit or sensory deficit. She exhibits normal muscle tone. Coordination normal.  Skin: Skin is warm, dry and intact.  Psychiatric: Her behavior is normal. Judgment and thought content normal.  Sad, tearful  Nursing note and vitals reviewed.   ED Course  Procedures (including critical care time)  Medications   acetaminophen (TYLENOL) tablet 650 mg (not administered)  ibuprofen (ADVIL,MOTRIN) tablet 600 mg (not administered)  zolpidem (AMBIEN) tablet 5 mg (not administered)  nicotine (NICODERM CQ - dosed in mg/24 hours) patch 21 mg (not administered)  ondansetron (ZOFRAN) tablet 4 mg (not administered)  alum & mag hydroxide-simeth (MAALOX/MYLANTA) 200-200-20 MG/5ML suspension 30 mL (not administered)    Patient Vitals for the past 24 hrs:  BP Temp Temp src Pulse Resp SpO2  10/15/15 1618 107/66 mmHg 98.3 F (36.8 C) Oral 68 17 100 %  10/15/15 1419 - - - - - 99 %  10/15/15 1408 105/77 mmHg 98.3 F (36.8 C) Oral 71 15 100 %    4:44 PM Reevaluation with update and discussion. After initial assessment and treatment, an updated evaluation reveals No change in clinical status. At this time she is medically cleared. Emmersen Garraway L   Consult TTS   Labs Review Labs Reviewed  COMPREHENSIVE METABOLIC PANEL - Abnormal; Notable for the following:    Sodium 132 (*)    Potassium 3.3 (*)    BUN <5 (*)    Creatinine, Ser 0.35 (*)    Calcium 8.5 (*)    Total Protein 6.3 (*)    Albumin 3.3 (*)    All other components within normal limits  CBC WITH DIFFERENTIAL/PLATELET - Abnormal; Notable for the following:    RBC 3.45 (*)    Hemoglobin 10.1 (*)    HCT 29.8 (*)    All other components within normal limits  URINE RAPID DRUG SCREEN, HOSP PERFORMED - Abnormal; Notable for the following:    Opiates POSITIVE (*)    Tetrahydrocannabinol POSITIVE (*)    All other components within normal limits  SALICYLATE LEVEL  ACETAMINOPHEN LEVEL  URINALYSIS, ROUTINE W REFLEX MICROSCOPIC (NOT AT [facility])  ACETAMINOPHEN LEVEL    Imaging Review No results found. I have personally reviewed and evaluated these images and lab results as part of my medical decision-making.   EKG Interpretation   Date/Time:  Wednesday October 15 2015 14:35:58 EDT Ventricular Rate:  69 PR Interval:    QRS Duration: 88 QT Interval:   396 QTC Calculation: 425 R Axis:   33 Text Interpretation:  Sinus rhythm Baseline wander in lead(s) V6 No old  tracing to compare Confirmed by [facility]  MD, Kethan Papadopoulos [phone]) on 10/15/2015  2:38:27 PM      MDM   Final diagnoses:  Narcotic overdose, intentional self-harm, initial encounter (HCC)    Depression with suicide attempt, narcotics overdose, 4 pills total. Unlikely to represent a Tylenol toxicity or unstable hemodynamic state. Evaluation by TTS for possible placement.  Nursing Notes Reviewed/ Care Coordinated, and agree without changes. Applicable Imaging Reviewed.  Interpretation of Laboratory Data incorporated into ED treatment  Plan- as per TTS in conjunction with oncoming provider team   [facility]  Effie ShyWentz, MD 10/17/15 1231

## 2015-10-15 NOTE — ED Notes (Signed)
Pt 5 mos pregnant, presents with depression and SI.  Pt took 4 Vicodin and left a suicide note.  Pt reports she has been depressed for years.  Denies HI or AVH, denies feeling hopeless.  Pt reports she was taking Celexa at home for depression, [facility].  Admits to marijuana use.  AAO x 3, no distress noted, denies pain, monitoring for safety, Q 15 min checks in effect.

## 2015-10-15 NOTE — ED Notes (Signed)
Belongings sent home with mom

## 2015-10-15 NOTE — ED Notes (Addendum)
Call placed to Poison Control.    Poison Control recommends EKG, cardiac monitoring, 4 hour post Tylenol level, and supportive care.   Spoke Coralyn MarkMary Ann McEachern, RN

## 2015-10-16 ENCOUNTER — Encounter (HOSPITAL_COMMUNITY): Payer: Self-pay | Admitting: *Deleted

## 2015-10-16 ENCOUNTER — Inpatient Hospital Stay (HOSPITAL_COMMUNITY)
Admission: AD | Admit: 2015-10-16 | Discharge: 2015-10-18 | DRG: 781 | Disposition: A | Discharge status: Home / Self Care | Payer: Medicaid Other | Source: Intra-hospital | Attending: Psychiatry | Admitting: Psychiatry

## 2015-10-16 DIAGNOSIS — T1491 Suicide attempt: Secondary | ICD-10-CM | POA: Diagnosis not present

## 2015-10-16 DIAGNOSIS — F332 Major depressive disorder, recurrent severe without psychotic features: Secondary | ICD-10-CM | POA: Diagnosis present

## 2015-10-16 DIAGNOSIS — O99342 Other mental disorders complicating pregnancy, second trimester: Secondary | ICD-10-CM | POA: Diagnosis present

## 2015-10-16 DIAGNOSIS — Z3A00 Weeks of gestation of pregnancy not specified: Secondary | ICD-10-CM | POA: Diagnosis not present

## 2015-10-16 DIAGNOSIS — R45851 Suicidal ideations: Secondary | ICD-10-CM

## 2015-10-16 DIAGNOSIS — F322 Major depressive disorder, single episode, severe without psychotic features: Secondary | ICD-10-CM | POA: Diagnosis present

## 2015-10-16 DIAGNOSIS — T402X2A Poisoning by other opioids, intentional self-harm, initial encounter: Secondary | ICD-10-CM | POA: Diagnosis not present

## 2015-10-16 DIAGNOSIS — F329 Major depressive disorder, single episode, unspecified: Secondary | ICD-10-CM | POA: Diagnosis present

## 2015-10-16 DIAGNOSIS — T40692A Poisoning by other narcotics, intentional self-harm, initial encounter: Secondary | ICD-10-CM | POA: Diagnosis not present

## 2015-10-16 DIAGNOSIS — T1491XA Suicide attempt, initial encounter: Secondary | ICD-10-CM

## 2015-10-16 MED ORDER — POTASSIUM CHLORIDE 20 MEQ PO PACK: 20.0000 meq | PACK | Freq: Two times a day (BID) | ORAL | Status: DC

## 2015-10-16 MED ORDER — CITALOPRAM HYDROBROMIDE 20 MG PO TABS
20.0000 mg | ORAL_TABLET | Freq: Every day | ORAL | Status: DC
  Administered 2015-10-17 – 2015-10-18 (×2): 20 mg via ORAL
  Filled 2015-10-16 (×4): qty 1

## 2015-10-16 MED ORDER — COMPLETENATE 29-1 MG PO CHEW
1.0000 | CHEWABLE_TABLET | Freq: Every day | ORAL | Status: DC
  Filled 2015-10-16: qty 1

## 2015-10-16 MED ORDER — POTASSIUM CHLORIDE 20 MEQ PO PACK
20.0000 meq | PACK | Freq: Once | ORAL | Status: DC
  Filled 2015-10-16: qty 1

## 2015-10-16 MED ORDER — MAGNESIUM HYDROXIDE 400 MG/5ML PO SUSP: 30.0000 mL | Freq: Every day | ORAL | Status: DC | PRN

## 2015-10-16 MED ORDER — POTASSIUM CHLORIDE CRYS ER 20 MEQ PO TBCR
20.0000 meq | EXTENDED_RELEASE_TABLET | Freq: Once | ORAL | Status: AC
  Administered 2015-10-16: 20 meq via ORAL
  Filled 2015-10-16: qty 1

## 2015-10-16 MED ORDER — ACETAMINOPHEN 325 MG PO TABS
650.0000 mg | ORAL_TABLET | Freq: Four times a day (QID) | ORAL | Status: DC | PRN
Start: 2015-10-16 — End: 2015-10-18

## 2015-10-16 MED ORDER — TRAZODONE HCL 50 MG PO TABS: 50.0000 mg | ORAL_TABLET | Freq: Every evening | ORAL | Status: DC | PRN

## 2015-10-16 MED ORDER — CITALOPRAM HYDROBROMIDE 10 MG PO TABS
20.0000 mg | ORAL_TABLET | Freq: Every day | ORAL | Status: DC
  Administered 2015-10-16: 20 mg via ORAL
  Filled 2015-10-16: qty 2

## 2015-10-16 MED ORDER — CVS PRENATAL GUMMY 0.4-113.5 MG PO CHEW: 1.0000 | CHEWABLE_TABLET | Freq: Every day | ORAL | Status: DC

## 2015-10-16 MED ORDER — COMPLETENATE 29-1 MG PO CHEW
1.0000 | CHEWABLE_TABLET | Freq: Every day | ORAL | Status: DC
  Administered 2015-10-16: 1 via ORAL
  Filled 2015-10-16: qty 1

## 2015-10-16 MED ORDER — ALUM & MAG HYDROXIDE-SIMETH 200-200-20 MG/5ML PO SUSP: 30.0000 mL | ORAL | Status: DC | PRN

## 2015-10-16 NOTE — ED Notes (Signed)
[facility] contacted for transport 

## 2015-10-16 NOTE — BH Assessment (Signed)
[facility] Assessment Progress Note  Per Thedore MinsMojeed Akintayo, MD, this pt requires psychiatric hospitalization at this time.  Lillia AbedLindsay, RN, [id number], contingent upon EDP's note medically clearing her.  This has been completed.  Pt has signed Voluntary Admission and Consent for Treatment, as well as Consent to Release Information to her mother, and signed [facility].  Pt's nurse, Wille CelesteJanie, has been notified, and agrees to send original paperwork along with pt via [facility], and to call report to [phone].  Doylene Canninghomas Adeline Petitfrere, MA Triage Specialist [phone]

## 2015-10-16 NOTE — ED Notes (Signed)
Pt on phone to call her mother--[facility]

## 2015-10-16 NOTE — ED Notes (Signed)
Pt's mom into see 

## 2015-10-16 NOTE — ED Notes (Addendum)
[facility]o difficulty [facility].  Pt has not belongings.
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
## 2015-10-16 NOTE — Progress Notes (Signed)
Pt signed 72 hour request for discharge on 10/16/15 at 1620.

## 2015-10-16 NOTE — ED Notes (Signed)
Up to the bathroom 
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
## 2015-10-16 NOTE — ED Notes (Signed)
[facility]

## 2015-10-16 NOTE — Tx Team (Signed)
Initial Interdisciplinary Treatment Plan   PATIENT STRESSORS: Financial difficulties Marital or family conflict Occupational concerns   PATIENT STRENGTHS: Average or above average intelligence Communication skills General fund of knowledge Supportive family/friends   PROBLEM LIST: Problem List/Patient Goals Date to be addressed Date deferred Reason deferred Estimated date of resolution  At risk for suicide 10/16/2015  10/16/2015   D/C  Depression 10/16/2015  10/16/2015   D/C  "Coping with life" 10/16/2015  10/16/2015   D/C  "Strengthening my mental state" 10/16/2015  10/16/2015   D/C                                 DISCHARGE CRITERIA:  Ability to meet basic life and health needs Adequate post-discharge living arrangements Improved stabilization in mood, thinking, and/or behavior Medical problems require only outpatient monitoring Motivation to continue treatment in a less acute level of care Need for constant or close observation no longer present Reduction of life-threatening or endangering symptoms to within safe limits  PRELIMINARY DISCHARGE PLAN: Outpatient therapy Return to previous living arrangement  PATIENT/FAMIILY INVOLVEMENT: This treatment plan has been presented to and reviewed with the patient, Julia Webb.  The patient and family have been given the opportunity to ask questions and make suggestions.  Larry SierrasMiddleton, Tryone Kille P 10/16/2015, 4:01 PM

## 2015-10-17 ENCOUNTER — Encounter (HOSPITAL_COMMUNITY): Payer: Self-pay | Admitting: Psychiatry

## 2015-10-17 DIAGNOSIS — F332 Major depressive disorder, recurrent severe without psychotic features: Secondary | ICD-10-CM

## 2015-10-17 DIAGNOSIS — T402X2A Poisoning by other opioids, intentional self-harm, initial encounter: Secondary | ICD-10-CM

## 2015-10-17 DIAGNOSIS — T1491 Suicide attempt: Secondary | ICD-10-CM

## 2015-10-17 DIAGNOSIS — T1491XA Suicide attempt, initial encounter: Secondary | ICD-10-CM

## 2015-10-17 MED ORDER — PRENATAL PLUS 27-1 MG PO TABS
1.0000 | ORAL_TABLET | Freq: Every day | ORAL | Status: DC
  Administered 2015-10-17 – 2015-10-18 (×3): 1 via ORAL
  Filled 2015-10-17 (×3): qty 1

## 2015-10-17 NOTE — Progress Notes (Signed)
D: Pt was flat, isolative and withdrawn to self even while in the dayroom Pt endorsed moderate anxiety and moderate depression; states, "I just don't know how it all these will end." Pt denied pain, SI, HI or AVH. Pt was flat however remained calm and cooperative. A: Medications offered as prescribed.  Support, encouragement, and safe environment provided.  15-minute safety checks continue. R: Pt was med compliant.  Pt attended karaoke group. Safety checks continue.

## 2015-10-17 NOTE — Progress Notes (Signed)
D Teneka has been quiet and isolative today. She is seen sitting quietly in the dayroom. A She completes her daily assessment and on it she wrote she deneid SI today and she rated her depression, hopellessness and axneity " 0/0/0/", respectively. Reports she feels fetal movement. R Safety in place.

## 2015-10-17 NOTE — Progress Notes (Signed)
Recreation Therapy Notes  Date: 07.14.2017 Time: 9:30am Location: [address] Group Room   Group Topic: Stress Management  Goal Area(s) Addresses:  Patient will actively participate in stress management techniques presented during session.   Behavioral Response: Engaged, Attentive  Intervention: Stress management techniques  Activity : Deep Breathing and Progressive Body Relaxation. LRT provided education, instruction and demonstration on practice of Deep Breathing and Progressive Body Relaxation. Patient was asked to participate in technique introduced during session.   Education:  Stress Management, Discharge Planning.   Education Outcome: Acknowledges education  Clinical Observations/Feedback: Patient actively engaged in technique introduced, expressed no concerns and demonstrated ability to practice independently post d/c.   Marykay Lexenise L Liandro Thelin, LRT/CTRS        Jearl KlinefelterBlanchfield, Othal Kubitz L 10/17/2015 5:56 PM
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
## 2015-10-17 NOTE — Progress Notes (Signed)
Adult Psychoeducational Group Note  Date:  10/17/2015 Time:  10:09 PM  Group Topic/Focus:  Wrap-Up Group:   The focus of this group is to help patients review their daily goal of treatment and discuss progress on daily workbooks.  Participation Level:  Active  Participation Quality:  Appropriate and Supportive  Affect:  Appropriate  Cognitive:  Alert, Appropriate and Oriented  Insight: Appropriate  Engagement in Group:  Engaged  Modes of Intervention:  Discussion  Additional Comments:  Patient stated that she had a good day.  For her relapse prevention plan, she hopes practice deep breathing and exercises   Zahraa Bhargava W Viola Kinnick 10/17/2015, 10:09 PM

## 2015-10-17 NOTE — [facility] Group Notes (Signed)
   [facility]  10/17/2015  8:45 AM   Participation Quality: Alert, Appropriate and Oriented  Mood/Affect: Blunted  Depression Rating: 1  Anxiety Rating: 0  Thoughts of Suicide: Pt denies SI/HI  Will you contract for safety? Yes  Current AVH: Pt denies  Plan for Discharge/Comments: Pt attended discharge planning group and actively participated in group. CSW provided pt with today's workbook. Patient plans to return home to follow up with outpatient services.   Transportation Means: Pt reports access to transportation  Supports: No supports mentioned at this time  Samuella BruinKristin Kazue Cerro, MSW, Johnson & JohnsonLCSW Clinical Social Worker [facility] [phone]
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
## 2015-10-18 ENCOUNTER — Encounter (HOSPITAL_COMMUNITY): Payer: Self-pay | Admitting: Registered Nurse

## 2015-10-18 NOTE — Progress Notes (Addendum)
MD completed DC order and SRA. Pt is approx  Pt completed her daily assessment and on it she wrote  She denied SI today and on it she rated  Her depression,, hopelessness and anxiety " 0/0/0", respectively. She remains pregnant, states she has fetal, movement and that she will  See her  gyn dr at discharge. She was given a cc of her suicide safety plan she completed on admission, as well as all belongings previously locked up are given to her as well. She states she understands and that she will comply with follow upplans.
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
## 2015-10-18 NOTE — [facility] Group Notes (Signed)
[facility] Group Notes:  (Nursing/MHT/Case Management/Adjunct)  Date:  10/18/2015  Time:  0900  Type of Therapy:  Nurse Education:  Goals Group:   The group focuses on teaching patients how to identify attainable goals that will aid them in their recovery.  Participation Level:  Did Not Attend  Participation Quality:    Affect:    Cognitive:    Insight:    Engagement in Group:    Modes of Intervention:    Summary of Progress/Problems:  Rich BraveDuke, Oretha Weismann Lynn 10/18/2015, 10:25 AM

## 2015-10-18 NOTE — Progress Notes (Signed)
D: Pt denied depression, anxiety, pain, SI, HI or AVH; states, "That was my mother that just left, she has really made my day." Pt was observed smiling, calm and cooperative. A: Pt had no schedule meds.  Support, encouragement, and safe environment provided.  15-minute safety checks continue. R: Pt attended group. Safety checks continue.

## 2015-10-18 NOTE — [facility] Group Notes (Signed)
[facility] LCSW Group Therapy  10/18/2015   Type of Therapy:  Group Therapy  Participation Level:  Active  Participation Quality:  Appropriate and Attentive  Affect:  Appropriate  Cognitive:  Alert and Oriented  Insight:  Improving  Engagement in Therapy:  Improving  Modes of Intervention:  Discussion  Summary of Progress/Problems: Group today discussed different areas of self-care. These included Physical Self-Care, Spiritual Self-Care, Lifestyle Habits, Social Supports and Mental and Emotional Self-Care. Patients were able to discuss ways they could improve self-care in each of these areas as well as celebrate areas where they have done well working on self-care. Patient identified that she wanted to work on mental and emotional self-care and was excited to acknowledge progress in other areas.   Beverly SessionsLINDSEY, Rumor Sun J 10/18/2015,

## 2015-10-18 NOTE — [facility] Suicide Risk Assessment (Signed)
[facility] INPATIENT:  Family/Significant Other Suicide Prevention Education  Suicide Prevention Education:  Contact Attempts: Darlina GuysShantel Kato, Mom, at [phone] has been identified by the patient as the family member/significant other with whom the patient will be residing, and identified as the person(s) who will aid the patient in the event of a mental health crisis.  With written consent from the patient, two attempts were made to provide suicide prevention education, prior to and/or following the patient's discharge.  We were unsuccessful in providing suicide prevention education.  A suicide education pamphlet was given to the patient to share with family/significant other.  Date and time of first attempt: 10/18/2015 at 11:53 AM by Julia Guadalajaraywan Lindsay, LCSW Date and time of second attempt: 10/18/2015 at 1:51 PM By Carney Bernatherine C Lennyn Gange, LCSW   Writer provided suicide prevention education directly to patient; conversation included risk factors, warning signs and resources to contact for help. Mobile crisis services explained and  explanations given as to resources which will be included on patient's discharge paperwork.   Julia Webb, Julia Webb 10/18/2015, 1:51 PM

## 2015-10-18 NOTE — Progress Notes (Signed)
  [facility] :  Will you be returning to the same living situation after discharge:  Yes,  with mother At discharge, do you have transportation home?: Yes,  with mother Do you have the ability to pay for your medications: Yes,  with help  Release of information consent forms completed and in the chart;  Patient's signature needed at discharge.  Patient to Follow up at: Follow-up Information    [facility] In 10 days.   Specialty:  Behavioral Health   Why:  Within 10 days patient to [facility] for Iniotial Assessment for services for med management and therapy   Contact information:   [address] [place] [address] [phone]       Next level of care provider has access to [facility] Link:no  Safety Planning and Suicide Prevention discussed: Yes,  with both patient and pt's mother  Have you used any form of tobacco in the last 30 days? (Cigarettes, Smokeless Tobacco, Cigars, and/or Pipes): No  Has patient been referred to the Quitline?: N/A patient is not a smoker  Patient has been referred for addiction treatment: N/A  Clide DalesHarrill, Mechel Schutter Campbell 10/18/2015, 6:27 PM

## 2015-10-18 NOTE — [facility] Suicide Risk Assessment (Signed)
[facility] INPATIENT:  Family/Significant Other Suicide Prevention Education  Suicide Prevention Education:  Education Completed;Patient's mother, Darlina GuysShantel Belfield, has been identified by the patient as the family member/significant other with whom the patient will be residing, and identified as the person(s) who will aid the patient in the event of a mental health crisis (suicidal ideations/suicide attempt).  With written consent from the patient, the family member/significant other has been provided the following suicide prevention education, prior to the and/or following the discharge of the patient.  SCW provided SPE to patient's mother in the [facility] L[facility] during face to face conversation at 3:35 PM  The suicide prevention education provided includes the following:  Suicide risk factors  Suicide prevention and interventions  National Suicide Hotline telephone number  [facility] assessment telephone number  [facility] Emergency Assistance 911  [facility] and/or Residential Mobile Crisis Unit telephone number  Request made of family/significant other to:  Remove weapons (e.g., guns, rifles, knives), all items previously/currently identified as safety concern.    Remove drugs/medications (over-the-counter, prescriptions, illicit drugs), all items previously/currently identified as a safety concern.  The family member/significant other verbalizes understanding of the suicide prevention education information provided.  The family member/significant other agrees to remove the items of safety concern listed above.  Clide DalesHarrill, Catherine Campbell 10/18/2015, 3:47 PM
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
## 2015-11-05 ENCOUNTER — Ambulatory Visit (INDEPENDENT_AMBULATORY_CARE_PROVIDER_SITE_OTHER): Payer: Medicaid Other | Admitting: Certified Nurse Midwife

## 2015-11-05 VITALS — BP 103/78 | HR 93 | Temp 99.1°F | Wt 140.0 lb

## 2015-11-05 DIAGNOSIS — Z3492 Encounter for supervision of normal pregnancy, unspecified, second trimester: Secondary | ICD-10-CM

## 2015-11-05 DIAGNOSIS — Z3402 Encounter for supervision of normal first pregnancy, second trimester: Secondary | ICD-10-CM

## 2015-11-05 LAB — POCT URINALYSIS DIPSTICK
BILIRUBIN UA: NEGATIVE
Blood, UA: NEGATIVE
GLUCOSE UA: NEGATIVE
KETONES UA: NEGATIVE
LEUKOCYTES UA: NEGATIVE
Nitrite, UA: NEGATIVE
Protein, UA: NEGATIVE
Spec Grav, UA: <=1.005
Urobilinogen, UA: 0.2
pH, UA: 8

## 2015-11-05 MED ORDER — FAMOTIDINE 20 MG PO TABS: 20.0000 mg | ORAL_TABLET | Freq: Two times a day (BID) | ORAL | 3 refills | Status: DC

## 2015-11-05 MED ORDER — ONDANSETRON HCL 4 MG PO TABS: 4.0000 mg | ORAL_TABLET | Freq: Three times a day (TID) | ORAL | 1 refills | Status: DC | PRN

## 2015-11-05 NOTE — Progress Notes (Signed)
Patient is having pain upper abdominal midline- fels mostly in am/pm hours. Patient also states she would like a medication to help control her nausea and vomiting- occurring daily 1-2 times.

## 2015-11-05 NOTE — Patient Instructions (Addendum)
[facility]  [employer] Choices for Gastroesophageal Reflux Disease, Adult When you have gastroesophageal reflux disease (GERD), the foods you eat and your eating habits are very important. Choosing the right foods can help ease the discomfort of GERD. WHAT GENERAL GUIDELINES DO I NEED TO FOLLOW?  Choose fruits, vegetables, whole grains, low-fat dairy products, and low-fat meat, fish, and poultry.  Limit fats such as oils, salad dressings, butter, nuts, and avocado.  Keep a food diary to identify foods that cause symptoms.  Avoid foods that cause reflux. These may be different for different people.  Eat frequent small meals instead of three large meals each day.  Eat your meals slowly, in a relaxed setting.  Limit fried foods.  Cook foods using methods other than frying.  Avoid drinking alcohol.  Avoid drinking large amounts of liquids with your meals.  Avoid bending over or lying down until 2-3 hours after eating. WHAT FOODS ARE NOT RECOMMENDED? The following are some foods and drinks that may worsen your symptoms: Vegetables Tomatoes. Tomato juice. Tomato and spaghetti sauce. Chili peppers. Onion and garlic. Horseradish. Fruits Oranges, grapefruit, and lemon (fruit and juice). Meats High-fat meats, fish, and poultry. This includes hot dogs, ribs, ham, sausage, salami, and bacon. Dairy Whole milk and chocolate milk. Sour cream. Cream. Butter. Ice cream. Cream cheese.  Beverages Coffee and tea, with or without caffeine. Carbonated beverages or energy drinks. Condiments Hot sauce. Barbecue sauce.  Sweets/Desserts Chocolate and cocoa. Donuts. Peppermint and spearmint. Fats and Oils High-fat foods, including [place] fries and potato chips. Other Vinegar. Strong spices, such as black pepper, white pepper, red pepper, cayenne, curry powder, cloves, ginger, and chili powder. The items listed above may not be a complete list of foods and  beverages to avoid. Contact your dietitian for more information.   This information is not intended to replace advice given to you by your health care provider. Make sure you discuss any questions you have with your health care provider.   Document Released: 03/22/2005 Document Revised: 04/12/2014 Document Reviewed: 01/24/2013 Elsevier Interactive Patient Education 2016 Elsevier     Preterm Labor Information Preterm labor is when labor starts at less than 37 weeks of pregnancy. The normal length of a pregnancy is 39 to 41 weeks. CAUSES Often, there is no identifiable underlying cause as to why a woman goes into preterm labor. One of the most common known causes of preterm labor is infection. Infections of the uterus, cervix, vagina, amniotic sac, bladder, kidney, or even the lungs (pneumonia) can cause labor to start. Other suspected causes of preterm labor include:   Urogenital infections, such as yeast infections and bacterial vaginosis.   Uterine abnormalities (uterine shape, uterine septum, fibroids, or bleeding from the placenta).   A cervix that has been operated on (it may fail to stay closed).   Malformations in the fetus.   Multiple gestations (twins, triplets, and so on).   Breakage of the amniotic sac.  RISK FACTORS  Having a previous history of preterm labor.   Having premature rupture of membranes (PROM).   Having a placenta that covers the opening of the cervix (placenta previa).   Having a placenta that separates from the uterus (placental abruption).   Having a cervix that is too weak to hold the fetus in the uterus (incompetent cervix).   Having too much fluid in the amniotic sac (polyhydramnios).   Taking illegal drugs or smoking while pregnant.   Not gaining enough weight while  pregnant.   Being younger than 60 and older than 27 years old.   Having a low socioeconomic status.   Being African American. SYMPTOMS Signs and symptoms of  preterm labor include:   Menstrual-like cramps, abdominal pain, or back pain.  Uterine contractions that are regular, as frequent as six in an hour, regardless of their intensity (may be mild or painful).  Contractions that start on the top of the uterus and spread down to the lower abdomen and back.   A sense of increased pelvic pressure.   A watery or bloody mucus discharge that comes from the vagina.  TREATMENT Depending on the length of the pregnancy and other circumstances, your health care provider may suggest bed rest. If necessary, there are medicines that can be given to stop contractions and to mature the fetal lungs. If labor happens before 34 weeks of pregnancy, a prolonged hospital stay may be recommended. Treatment depends on the condition of both you and the fetus.  WHAT SHOULD YOU DO IF YOU THINK YOU ARE IN PRETERM LABOR? Call your health care provider right away. You will need to go to the hospital to get checked immediately. HOW CAN YOU PREVENT PRETERM LABOR IN FUTURE PREGNANCIES? You should:   Stop smoking if you smoke.  Maintain healthy weight gain and avoid chemicals and drugs that are not necessary.  Be watchful for any type of infection.  Inform your health care provider if you have a known history of preterm labor.   This information is not intended to replace advice given to you by your health care provider. Make sure you discuss any questions you have with your health care provider.   Document Released: 06/12/2003 Document Revised: 11/22/2012 Document Reviewed: 04/24/2012 Elsevier Interactive Patient Education 2016 [employer].       Preterm Labor Information Preterm labor is when labor starts at less than 37 weeks of pregnancy. The normal length of a pregnancy is 39 to 41 weeks. CAUSES Often, there is no identifiable underlying cause as to why a woman goes into preterm labor. One of the most common known causes of preterm labor is infection.  Infections of the uterus, cervix, vagina, amniotic sac, bladder, kidney, or even the lungs (pneumonia) can cause labor to start. Other suspected causes of preterm labor include:   Urogenital infections, such as yeast infections and bacterial vaginosis.   Uterine abnormalities (uterine shape, uterine septum, fibroids, or bleeding from the placenta).   A cervix that has been operated on (it may fail to stay closed).   Malformations in the fetus.   Multiple gestations (twins, triplets, and so on).   Breakage of the amniotic sac.  RISK FACTORS  Having a previous history of preterm labor.   Having premature rupture of membranes (PROM).   Having a placenta that covers the opening of the cervix (placenta previa).   Having a placenta that separates from the uterus (placental abruption).   Having a cervix that is too weak to hold the fetus in the uterus (incompetent cervix).   Having too much fluid in the amniotic sac (polyhydramnios).   Taking illegal drugs or smoking while pregnant.   Not gaining enough weight while pregnant.   Being younger than 59 and older than 27 years old.   Having a low socioeconomic status.   Being African American. SYMPTOMS Signs and symptoms of preterm labor include:   Menstrual-like cramps, abdominal pain, or back pain.  Uterine contractions that are regular, as frequent as six in  an hour, regardless of their intensity (may be mild or painful).  Contractions that start on the top of the uterus and spread down to the lower abdomen and back.   A sense of increased pelvic pressure.   A watery or bloody mucus discharge that comes from the vagina.  TREATMENT Depending on the length of the pregnancy and other circumstances, your health care provider may suggest bed rest. If necessary, there are medicines that can be given to stop contractions and to mature the fetal lungs. If labor happens before 34 weeks of pregnancy, a prolonged  hospital stay may be recommended. Treatment depends on the condition of both you and the fetus.  WHAT SHOULD YOU DO IF YOU THINK YOU ARE IN PRETERM LABOR? Call your health care provider right away. You will need to go to the hospital to get checked immediately. HOW CAN YOU PREVENT PRETERM LABOR IN FUTURE PREGNANCIES? You should:   Stop smoking if you smoke.  Maintain healthy weight gain and avoid chemicals and drugs that are not necessary.  Be watchful for any type of infection.  Inform your health care provider if you have a known history of preterm labor.   This information is not intended to replace advice given to you by your health care provider. Make sure you discuss any questions you have with your health care provider.   Document Released: 06/12/2003 Document Revised: 11/22/2012 Document Reviewed: 04/24/2012 Elsevier Interactive Patient Education [employer]
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
## 2015-11-19 ENCOUNTER — Ambulatory Visit (INDEPENDENT_AMBULATORY_CARE_PROVIDER_SITE_OTHER): Payer: Medicaid Other | Admitting: Certified Nurse Midwife

## 2015-11-19 ENCOUNTER — Other Ambulatory Visit: Payer: Medicaid Other

## 2015-11-19 VITALS — BP 104/66 | HR 69 | Temp 98.3°F | Wt 141.4 lb

## 2015-11-19 DIAGNOSIS — Z3402 Encounter for supervision of normal first pregnancy, second trimester: Secondary | ICD-10-CM

## 2015-11-19 DIAGNOSIS — Z3403 Encounter for supervision of normal first pregnancy, third trimester: Secondary | ICD-10-CM

## 2015-11-19 LAB — POCT URINALYSIS DIPSTICK
BILIRUBIN UA: NEGATIVE
Glucose, UA: NEGATIVE
KETONES UA: NEGATIVE
LEUKOCYTES UA: NEGATIVE
Nitrite, UA: NEGATIVE
PH UA: 5
PROTEIN UA: NEGATIVE
RBC UA: NEGATIVE
Spec Grav, UA: 1.015
Urobilinogen, UA: 0.2

## 2015-11-19 NOTE — Progress Notes (Signed)
Patient reports she is doing well with no concerns today.
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
## 2015-11-19 NOTE — Patient Instructions (Addendum)
How a Baby Grows During Pregnancy Pregnancy begins when a female's sperm enters a female's egg (fertilization). This happens in one of the tubes (fallopian tubes) that connect the ovaries to the womb (uterus). The fertilized egg is called an embryo until it reaches 10 weeks. From 10 weeks until birth, it is called a fetus. The fertilized egg moves down the fallopian tube to the uterus. Then it implants into the lining of the uterus and begins to grow. The developing fetus receives oxygen and nutrients through the pregnant woman's bloodstream and the tissues that grow (placenta) to support the fetus. The placenta is the life support system for the fetus. It provides nutrition and removes waste. Learning as much as you can about your pregnancy and how your baby is developing can help you enjoy the experience. It can also make you aware of when there might be a problem and when to ask questions. HOW LONG DOES A TYPICAL PREGNANCY LAST? A pregnancy usually lasts 280 days, or about 40 weeks. Pregnancy is divided into three trimesters:  First trimester: 0-13 weeks.  Second trimester: 14-27 weeks.  Third trimester: 28-40 weeks. The day when your baby is considered ready to be born (full term) is your estimated date of delivery. HOW DOES MY BABY DEVELOP MONTH BY MONTH? First month  The fertilized egg attaches to the inside of the uterus.  Some cells will form the placenta. Others will form the fetus.  The arms, legs, brain, spinal cord, lungs, and heart begin to develop.  At the end of the first month, the heart begins to beat. Second month  The bones, inner ear, eyelids, hands, and feet form.  The genitals develop.  By the end of 8 weeks, all major organs are developing. Third month  All of the internal organs are forming.  Teeth develop below the gums.  Bones and muscles begin to grow. The spine can flex.  The skin is transparent.  Fingernails and toenails begin to form.  Arms and  legs continue to grow longer, and hands and feet develop.  The fetus is about 3 in (7.6 cm) long. Fourth month  The placenta is completely formed.  The external sex organs, neck, outer ear, eyebrows, eyelids, and fingernails are formed.  The fetus can hear, swallow, and move its arms and legs.  The kidneys begin to produce urine.  The skin is covered with a white waxy coating (vernix) and very fine hair (lanugo). Fifth month  The fetus moves around more and can be felt for the first time (quickening).  The fetus starts to sleep and wake up and may begin to suck its finger.  The nails grow to the end of the fingers.  The organ in the digestive system that makes bile (gallbladder) functions and helps to digest the nutrients.  If your baby is a girl, eggs are present in her ovaries. If your baby is a boy, testicles start to move down into his scrotum. Sixth month  The lungs are formed, but the fetus is not yet able to breathe.  The eyes open. The brain continues to develop.  Your baby has fingerprints and toe prints. Your baby's hair grows thicker.  At the end of the second trimester, the fetus is about 9 in (22.9 cm) long. Seventh month  The fetus kicks and stretches.  The eyes are developed enough to sense changes in light.  The hands can make a grasping motion.  The fetus responds to sound. Eighth month  All organs and body systems are fully developed and functioning.  Bones harden and taste buds develop. The fetus may hiccup.  Certain areas of the brain are still developing. The skull remains soft. Ninth month  The fetus gains about  lb (0.23 kg) each week.  The lungs are fully developed.  Patterns of sleep develop.  The fetus's head typically moves into a head-down position (vertex) in the uterus to prepare for birth. If the buttocks move into a vertex position instead, the baby is breech.  The fetus weighs 6-9 lbs (2.72-4.08 kg) and is 19-20 in  (48.26-50.8 cm) long. WHAT CAN I DO TO HAVE A HEALTHY PREGNANCY AND HELP MY BABY DEVELOP? Eating and Drinking  Eat a healthy diet.  Talk with your health care provider to make sure that you are getting the nutrients that you and your baby need.  Visit www.BuildDNA.es to learn about creating a healthy diet.  Gain a healthy amount of weight during pregnancy as advised by your health care provider. This is usually 25-35 pounds. You may need to:  Gain more if you were underweight before getting pregnant or if you are pregnant with more than one baby.  Gain less if you were overweight or obese when you got pregnant. Medicines and Vitamins  Take prenatal vitamins as directed by your health care provider. These include vitamins such as folic acid, iron, calcium, and vitamin D. They are important for healthy development.  Take medicines only as directed by your health care provider. Read labels and ask a pharmacist or your health care provider whether over-the-counter medicines, supplements, and prescription drugs are safe to take during pregnancy. Activities  Be physically active as advised by your health care provider. Ask your health care provider to recommend activities that are safe for you to do, such as walking or swimming.  Do not participate in strenuous or extreme sports. Lifestyle  Do not drink alcohol.  Do not use any tobacco products, including cigarettes, chewing tobacco, or electronic cigarettes. If you need help quitting, ask your health care provider.  Do not use illegal drugs. Safety  Avoid exposure to mercury, lead, or other heavy metals. Ask your health care provider about common sources of these heavy metals.  Avoid listeria infection during pregnancy. Follow these precautions:  Do not eat soft cheeses or deli meats.  Do not eat hot dogs unless they have been warmed up to the point of steaming, such as in the microwave oven.  Do not drink unpasteurized  milk.  Avoid toxoplasmosis infection during pregnancy. Follow these precautions:  Do not change your cat's litter box, if you have a cat. Ask someone else to do this for you.  Wear gardening gloves while working in the yard. General Instructions  Keep all follow-up visits as directed by your health care provider. This is important. This includes prenatal care and screening tests.  Manage any chronic health conditions. Work closely with your health care provider to keep conditions, such as diabetes, under control. HOW DO I KNOW IF MY BABY IS DEVELOPING WELL? At each prenatal visit, your health care provider will do several different tests to check on your health and keep track of your baby's development. These include:  Fundal height.  Your health care provider will measure your growing belly from top to bottom using a tape measure.  Your health care provider will also feel your belly to determine your baby's position.  Heartbeat.  An ultrasound in the first trimester can confirm  pregnancy and show a heartbeat, depending on how far along you are.  Your health care provider will check your baby's heart rate at every prenatal visit.  As you get closer to your delivery date, you may have regular fetal heart rate monitoring to make sure that your baby is not in distress.  Second trimester ultrasound.  This ultrasound checks your baby's development. It also indicates your baby's gender. WHAT SHOULD I DO IF I HAVE CONCERNS ABOUT MY BABY'S DEVELOPMENT? Always talk with your health care provider about any concerns that you may have.   This information is not intended to replace advice given to you by your health care provider. Make sure you discuss any questions you have with your health care provider.   Document Released: 09/08/2007 Document Revised: 12/11/2014 Document Reviewed: 08/29/2013 Elsevier Interactive Patient Education [employer]  Third Trimester of Pregnancy The  third trimester is from week 29 through week 42, months 7 through 9. This trimester is when your unborn baby (fetus) is growing very fast. At the end of the ninth month, the unborn baby is about 20 inches in length. It weighs about 6-10 pounds.  HOME CARE   Avoid all smoking, herbs, and alcohol. Avoid drugs not approved by your doctor.  Do not use any tobacco products, including cigarettes, chewing tobacco, and electronic cigarettes. If you need help quitting, ask your doctor. You may get counseling or other support to help you quit.  Only take medicine as told by your doctor. Some medicines are safe and some are not during pregnancy.  Exercise only as told by your doctor. Stop exercising if you start having cramps.  Eat regular, healthy meals.  Wear a good support bra if your breasts are tender.  Do not use hot tubs, steam rooms, or saunas.  Wear your seat belt when driving.  Avoid raw meat, uncooked cheese, and liter boxes and soil used by cats.  Take your prenatal vitamins.  Take 1500-2000 milligrams of calcium daily starting at the 20th week of pregnancy until you deliver your baby.  Try taking medicine that helps you poop (stool softener) as needed, and if your doctor approves. Eat more fiber by eating fresh fruit, vegetables, and whole grains. Drink enough fluids to keep your pee (urine) clear or pale yellow.  Take warm water baths (sitz baths) to soothe pain or discomfort caused by hemorrhoids. Use hemorrhoid cream if your doctor approves.  If you have puffy, bulging veins (varicose veins), wear support hose. Raise (elevate) your feet for 15 minutes, 3-4 times a day. Limit salt in your diet.  Avoid heavy lifting, wear low heels, and sit up straight.  Rest with your legs raised if you have leg cramps or low back pain.  Visit your dentist if you have not gone during your pregnancy. Use a soft toothbrush to brush your teeth. Be gentle when you floss.  You can have sex  (intercourse) unless your doctor tells you not to.  Do not travel far distances unless you must. Only do so with your doctor's approval.  Take prenatal classes.  Practice driving to the hospital.  Pack your hospital bag.  Prepare the baby's room.  Go to your doctor visits. GET HELP IF:  You are not sure if you are in labor or if your water has broken.  You are dizzy.  You have mild cramps or pressure in your lower belly (abdominal).  You have a nagging pain in your belly area.  You continue  to feel sick to your stomach (nauseous), throw up (vomit), or have watery poop (diarrhea).  You have bad smelling fluid coming from your vagina.  You have pain with peeing (urination). GET HELP RIGHT AWAY IF:   You have a fever.  You are leaking fluid from your vagina.  You are spotting or bleeding from your vagina.  You have severe belly cramping or pain.  You lose or gain weight rapidly.  You have trouble catching your breath and have chest pain.  You notice sudden or extreme puffiness (swelling) of your face, hands, ankles, feet, or legs.  You have not felt the baby move in over an hour.  You have severe headaches that do not go away with medicine.  You have vision changes.   This information is not intended to replace advice given to you by your health care provider. Make sure you discuss any questions you have with your health care provider.   Document Released: 06/16/2009 Document Revised: 04/12/2014 Document Reviewed: 05/23/2012 Elsevier Interactive Patient Education 2016 [employer].  Before Baby Comes Home Before your baby arrives it is important to:  Have all of the supplies that you will need to care for your baby.  Know where to go if there is an emergency.  Discuss the baby's arrival with other family members. WHAT SUPPLIES WILL I NEED? It is recommended that you have the following supplies: Large Items  Crib.  Crib mattress.  Rear-facing infant  car seat. If possible, have a trained professional check to make sure that it is installed correctly. Feeding  6-8 bottles that are 4-5 oz in size.  6-8 nipples.  Bottle brush.  Sterilizer, or a large pan or kettle with a lid.  A way to boil and cool water.  If you will be breastfeeding:  Breast pump.  Nipple cream.  Nursing bra.  Breast pads.  Breast shields.  If you will be formula feeding:  Formula.  Measuring cups.  Measuring spoons. Bathing  Mild baby soap and baby shampoo.  Petroleum jelly.  Soft cloth towel and washcloth.  Hooded towel.  Cotton balls.  Bath basin. Other Supplies  Rectal thermometer.  Bulb syringe.  Baby wipes or washcloths for diaper changes.  Diaper bag.  Changing pad.  Clothing, including one-piece outfits and pajamas.  Baby nail clippers.  Receiving blankets.  Mattress pad and sheets for the crib.  Night-light for the baby's room.  Baby monitor.  2 or 3 pacifiers.  Either 24-36 cloth diapers and waterproof diaper covers or a box of disposable diapers. You may need to use as many as 10-12 diapers per day. HOW DO I PREPARE FOR AN EMERGENCY? Prepare for an emergency by:  Knowing how to get to the nearest hospital.  Listing the phone numbers of your baby's health care providers near your home phone and in your cell phone. HOW DO I PREPARE MY FAMILY?  Decide how to handle visitors.  If you have other children:  Talk with them about the baby coming home. Ask them how they feel about it.  Read a book together about being a new big brother or sister.  Find ways to let them help you prepare for the new baby.  Have someone ready to care for them while you are in the hospital.   This information is not intended to replace advice given to you by your health care provider. Make sure you discuss any questions you have with your health care provider.   Document  Released: 03/04/2008 Document Revised: 08/06/2014  Document Reviewed: 02/27/2014 Elsevier Interactive Patient Education 2016 [employer]  Pain Relief During Labor and Delivery Everyone experiences pain differently, but labor causes severe pain for many women. The amount of pain you experience during labor and delivery depends on your pain tolerance, contraction strength, and your baby's size and position. There are many ways to prepare for and deal with the pain, including:   Taking prenatal classes to learn about labor and delivery. The more informed you are, the less anxious and afraid you may be. This can help lessen the pain.  Taking pain-relieving medicine during labor and delivery.  Learning breathing and relaxation techniques.  Taking a shower or bath.  Getting massaged.  Changing positions.  Placing an ice pack on your back. Discuss your pain control options with your health care provider during your prenatal visits.  WHAT ARE THE TWO TYPES OF PAIN-RELIEVING MEDICINES? 1. Analgesics. These are medicines that decrease pain without total loss of feeling or muscle movement. 2. Anesthetics. These are medicines that block all feeling, including pain. There can be minor side effects of both types, such as nausea, trouble concentrating, becoming sleepy, and lowering the heart rate of the baby. However, health care providers are careful to give doses that will not seriously affect the baby.  WHAT ARE THE SPECIFIC TYPES OF ANALGESICS AND ANESTHETICS? Systemic Analgesic Systemic pain medicines affect your whole body rather than focusing pain relief on the area of your body experiencing pain. This type of medicine is given either through an IV tube in your vein or by a shot (injection) into your muscle. This medicine will lessen your pain but will not stop it completely. It may also make you sleepy, but it will not make you lose consciousness.  Local Anesthetic Local anesthetic isused tonumb a small area of your body. The medicine is  injected into the area of nerves that carry feeling to the vagina, vulva, or the area between the vagina and anus (perineum).  General Anesthetic This type of medicine causes you to lose consciousness so you do not feel pain. It is usually used only in emergency situations during labor. It is given through an IV tube or face mask. Paracervical Block A paracervical block is a form of local anesthesia given during labor. Numbing medicine is injected into the right and left sides of the cervix and vagina. It helps to lessen the pain caused by contractions and stretching of the cervix. It may have to be given more than once.  Pudendal Block A pudendal block is another form of local anesthesia. It is used to relieve the pain associated with pushing or stretching of the perineum at the time of delivery. An injection is given deep through the vaginal wall into the pudendal nerve in the pelvis, numbing the perineum.  Epidural Anesthetic An epidural is an injection of numbing medicine given in the lower back and into the epidural space near your spinal cord. The epidural numbs the lower half of your body. You may be able to move your legs but will not be allowed to walk. Epidurals can be used for labor, delivery, or cesarean deliveries.  To prevent the medicine from wearing off, a small tube (catheter) may be threaded into the epidural space and taped in place to prevent it from slipping out. Medicine can then be given continuously in small doses through the tube until you deliver. Spinal Block A spinal block is similar to an epidural, but the  medicine is injected into the spinal fluid, not the epidural space. A spinal block is only given once. It starts to relieve pain quickly but lasts only 1-2 hours. Spinal blocks can also be used for cesarean deliveries.  Combined Spinal-Epidural Block Combined spinal-epidural blocks combine the benefits of both the spinal and epidural blocks. The spinal part acts quickly  to relieve pain and the epidural provides continuous pain relief. Hydrotherapy Immersion in warm water during labor may provide comfort and relaxation. It may also help to lessen pain, the use of anesthesia, and the length of labor. However, immersion in water during the delivery (water birth) may have some risk involved and studies to determine safety and risks are ongoing. If you are a healthy woman who is expecting an uncomplicated birth, talk with your health care provider to see if water birth is an option for you.    This information is not intended to replace advice given to you by your health care provider. Make sure you discuss any questions you have with your health care provider.   Document Released: 07/08/2008 Document Revised: 03/27/2013 Document Reviewed: 08/10/2012 Elsevier Interactive Patient Education [employer]

## 2015-11-19 NOTE — Addendum Note (Signed)
Addended by: Marya LandryFOSTER, SUZANNE D on: 11/19/2015 11:06 AM   Modules accepted: Orders

## 2015-11-20 ENCOUNTER — Other Ambulatory Visit: Payer: Self-pay | Admitting: Certified Nurse Midwife

## 2015-11-20 DIAGNOSIS — Z3403 Encounter for supervision of normal first pregnancy, third trimester: Secondary | ICD-10-CM

## 2015-11-20 LAB — HIV ANTIBODY (ROUTINE TESTING W REFLEX): HIV Screen 4th Generation wRfx: NONREACTIVE

## 2015-11-20 LAB — GLUCOSE TOLERANCE, 2 HOURS W/ 1HR
GLUCOSE, 1 HOUR: 84 mg/dL (ref 65–179)
GLUCOSE, 2 HOUR: 55 mg/dL — AB (ref 65–152)
Glucose, Fasting: 72 mg/dL (ref 65–91)

## 2015-11-21 LAB — PRENATAL PROFILE I(LABCORP)
ANTIBODY SCREEN: NEGATIVE
Basophils Absolute: 0 10*3/uL (ref 0.0–0.2)
Basos: 0 %
EOS (ABSOLUTE): 0.1 10*3/uL (ref 0.0–0.4)
EOS: 1 %
HEMOGLOBIN: 10.2 g/dL — AB (ref 11.1–15.9)
HEP B S AG: NEGATIVE
Hematocrit: 32.3 % — ABNORMAL LOW (ref 34.0–46.6)
IMMATURE GRANS (ABS): 0 10*3/uL (ref 0.0–0.1)
IMMATURE GRANULOCYTES: 1 %
LYMPHS: 16 %
Lymphocytes Absolute: 1.4 10*3/uL (ref 0.7–3.1)
MCH: 28.9 pg (ref 26.6–33.0)
MCHC: 31.6 g/dL (ref 31.5–35.7)
MCV: 92 fL (ref 79–97)
MONOCYTES: 7 %
MONOS ABS: 0.7 10*3/uL (ref 0.1–0.9)
NEUTROS PCT: 75 %
Neutrophils Absolute: 6.6 10*3/uL (ref 1.4–7.0)
Platelets: 266 10*3/uL (ref 150–379)
RBC: 3.53 x10E6/uL — ABNORMAL LOW (ref 3.77–5.28)
RDW: 13.9 % (ref 12.3–15.4)
RH TYPE: POSITIVE
RPR Ser Ql: NONREACTIVE
RUBELLA: 4.02 {index} (ref >0.99)
WBC: 8.8 10*3/uL (ref 3.4–10.8)

## 2015-11-21 LAB — HEMOGLOBINOPATHY EVALUATION
HEMOGLOBIN A2 QUANTITATION: 2.2 % (ref 0.7–3.1)
HGB C: 0 %
HGB S: 0 %
Hemoglobin F Quantitation: 0 % (ref 0.0–2.0)
Hgb A: 97.8 % (ref 94.0–98.0)

## 2015-11-21 LAB — VARICELLA ZOSTER ANTIBODY, IGG: VARICELLA: 853 {index} (ref >165)

## 2015-11-21 LAB — TSH: TSH: 1.22 u[IU]/mL (ref 0.450–4.500)

## 2015-11-23 LAB — CULTURE, OB URINE

## 2015-11-23 LAB — URINE CULTURE, OB REFLEX

## 2015-11-24 ENCOUNTER — Telehealth: Payer: Self-pay | Admitting: *Deleted

## 2015-11-24 NOTE — Telephone Encounter (Signed)
Patient states she had a fall yesterday. She was outside and she fills she got overheated. She came into the house and had a fainting episode. She fell into the ironing board and slid down the wall. She did not hit her stomach. She reports her baby is moving fine, no cramping. No bleeding or fluid leakage. Her next appointment is 8/30. Told patient I would check with her provider and if we need to bring her in sooner- the front staff would call her.

## 2015-11-25 ENCOUNTER — Inpatient Hospital Stay (HOSPITAL_COMMUNITY)
Admission: AD | Admit: 2015-11-25 | Discharge: 2015-11-25 | Disposition: A | Discharge status: Home / Self Care | Payer: Medicaid Other | Source: Ambulatory Visit | Attending: Family Medicine | Admitting: Family Medicine

## 2015-11-25 ENCOUNTER — Other Ambulatory Visit: Payer: Self-pay | Admitting: Certified Nurse Midwife

## 2015-11-25 ENCOUNTER — Encounter (HOSPITAL_COMMUNITY): Payer: Self-pay

## 2015-11-25 DIAGNOSIS — F329 Major depressive disorder, single episode, unspecified: Secondary | ICD-10-CM | POA: Insufficient documentation

## 2015-11-25 DIAGNOSIS — Z87440 Personal history of urinary (tract) infections: Secondary | ICD-10-CM | POA: Diagnosis not present

## 2015-11-25 DIAGNOSIS — A499 Bacterial infection, unspecified: Secondary | ICD-10-CM | POA: Diagnosis not present

## 2015-11-25 DIAGNOSIS — Z3A29 29 weeks gestation of pregnancy: Secondary | ICD-10-CM | POA: Insufficient documentation

## 2015-11-25 DIAGNOSIS — N760 Acute vaginitis: Secondary | ICD-10-CM

## 2015-11-25 DIAGNOSIS — B9689 Other specified bacterial agents as the cause of diseases classified elsewhere: Secondary | ICD-10-CM | POA: Insufficient documentation

## 2015-11-25 DIAGNOSIS — Z8249 Family history of ischemic heart disease and other diseases of the circulatory system: Secondary | ICD-10-CM | POA: Insufficient documentation

## 2015-11-25 DIAGNOSIS — Z87891 Personal history of nicotine dependence: Secondary | ICD-10-CM | POA: Insufficient documentation

## 2015-11-25 DIAGNOSIS — O23593 Infection of other part of genital tract in pregnancy, third trimester: Secondary | ICD-10-CM | POA: Diagnosis not present

## 2015-11-25 DIAGNOSIS — O99343 Other mental disorders complicating pregnancy, third trimester: Secondary | ICD-10-CM | POA: Insufficient documentation

## 2015-11-25 DIAGNOSIS — Z91040 Latex allergy status: Secondary | ICD-10-CM | POA: Insufficient documentation

## 2015-11-25 LAB — FETAL FIBRONECTIN: FETAL FIBRONECTIN: NEGATIVE

## 2015-11-25 LAB — WET PREP, GENITAL
Sperm: NONE SEEN
TRICH WET PREP: NONE SEEN
YEAST WET PREP: NONE SEEN

## 2015-11-25 MED ORDER — LACTATED RINGERS IV BOLUS (SEPSIS)
1000.0000 mL | Freq: Once | INTRAVENOUS | Status: AC
  Administered 2015-11-25: 1000 mL via INTRAVENOUS

## 2015-11-25 MED ORDER — METRONIDAZOLE 500 MG PO TABS: 500.0000 mg | ORAL_TABLET | Freq: Two times a day (BID) | ORAL | 0 refills | Status: AC

## 2015-11-25 NOTE — MAU Provider Note (Signed)
Chief Complaint:  Contractions   First Provider Initiated Contact with Patient 11/25/15 2114     HPI: Murrell Converseoneka Moten is a 25 y.o. G1P0000 at 4835w0d who presents to maternity admissions reporting contractions.  Starting feeling cramping around noon, went to bed, woke up around 4 pm. Then around 6pm became more intense like contractions. Felt like sharp pain, left side of abdomen and at top, and lower pelvis. Pains were constant and also come/go. Could not move due to the pain. Still currently having the pain. Never had this before. No recent intercourse or anything in vagina in past 48 hours. Tolerating PO intake, drinking only 4-5 bottles of water/day.   Patient lost consciousness on Sunday, fell  Decreased FM, but does feel baby move.  Denies  leakage of fluid or vaginal bleeding.    Pregnancy Course:  Uncomplicated  Past Medical History: Past Medical History:  Diagnosis Date  . Alcohol abuse    Depression  . Chickenpox   . Depression   . Eating disorder    Bulemia, resolved  . Frequent headaches   . UTI (lower urinary tract infection)     Past obstetric history: OB History  Gravida Para Term Preterm AB Living  1 0 0 0 0 0  SAB TAB Ectopic Multiple Live Births  0 0 0 0      # Outcome Date GA Lbr Len/2nd Weight Sex Delivery Anes PTL Lv  1 Current               Past Surgical History: Past Surgical History:  Procedure Laterality Date  . ROOT CANAL    . ROOT CANAL       Family History: Family History  Problem Relation Age of Onset  . Hypertension Mother     Living  . Mental illness Mother   . Depression Mother   . Hypertension Father     Living  . Kidney Stones Father   . Sleep apnea Father   . Alcoholism Maternal Grandmother   . Hypertension Maternal Grandmother   . Arthritis Maternal Grandmother   . Prostate cancer Paternal Grandfather     Deceased  . Hypertension Paternal Grandmother   . Alzheimer's disease Other     Maternal Great Aunt  . Breast cancer  Maternal Aunt 50  . HIV Brother   . Mental illness Brother     Possible Bipolar #1  . Healthy Sister     Half-sister    Social History: Social History  Substance Use Topics  . Smoking status: Former Smoker    Packs/day: 0.00    Years: 1.00    Types: Cigars  . Smokeless tobacco: Never Used  . Alcohol use No    Allergies:  Allergies  Allergen Reactions  . Citrus Itching and Nausea And Vomiting    Citrus fruits, Strawberry, Watermelon  . Latex Itching and Rash    Meds:  No prescriptions prior to admission.    I have reviewed patient's Past Medical Hx, Surgical Hx, Family Hx, Social Hx, medications and allergies.   ROS:  Review of Systems  Physical Exam   Patient Vitals for the past 24 hrs:  BP Temp Temp src Pulse Resp Height Weight  11/25/15 2257 106/65 - - 70 16 - -  11/25/15 2103 - - - - - 5\' 4"  (1.626 m) 141 lb (64 kg)  11/25/15 2045 116/66 98.2 F (36.8 C) Oral 76 16 - -   Constitutional: Well-developed, well-nourished female in no acute distress.  Cardiovascular:  normal rate Respiratory: normal effort GI: Abd soft, non-tender, gravid appropriate for gestational age. Pos BS x 4 MS: Extremities nontender, no edema, normal ROM Neurologic: Alert and oriented x 4.  GU: Neg CVAT.  Pelvic: NEFG, physiologic discharge, no blood, cervix clean. No CMT  Dilation: Closed Effacement (%): Thick Cervical Position: Posterior Exam by:: dr Omer [place]  FHT:  Baseline 150, moderate variability, accelerations present, no decelerations Contractions: Irritable at first, resolved with rare contraction after IVF given.  I personally reviewed the patient's NST today, found to be REACTIVE.    Labs: Results for orders placed or performed during the hospital encounter of 11/25/15 (from the past 24 hour(s))  Wet prep, genital     Status: Abnormal   Collection Time: 11/25/15  9:22 PM  Result Value Ref Range   Yeast Wet Prep HPF POC NONE SEEN NONE SEEN   Trich, Wet Prep NONE SEEN  NONE SEEN   Clue Cells Wet Prep HPF POC PRESENT (A) NONE SEEN   WBC, Wet Prep HPF POC FEW (A) NONE SEEN   Sperm NONE SEEN   Fetal fibronectin     Status: None   Collection Time: 11/25/15  9:30 PM  Result Value Ref Range   Fetal Fibronectin NEGATIVE NEGATIVE    Imaging:  No results found.  MAU Course: IVF FFN, UA, wet prep, Gc/Chl SVE  8:15 PM - Reevaluated patient, her symptoms of cramping has improved, no longer feeling it. Wet prep +BV, discussed treatment plan with patient who is in agreement. Will finish IVF and discharge.   MDM: Plan of care reviewed with patient, including labs and tests ordered and medical treatment.   Assessment: 1. Bacterial vaginosis     Plan: Discharge home in stable condition.  Metronidazole treatment for BV Preterm Labor precautions and fetal kick counts Follow up with OB  Follow-up Information    [facility]).   Specialty:  Obstetrics and Gynecology Why:  Routine prenatal care Contact information: [address] [place] [id number] [phone]            Medication List    STOP taking these medications   famotidine 20 MG tablet Commonly known as:  PEPCID     TAKE these medications   acetaminophen 325 MG tablet Commonly known as:  TYLENOL Take 650 mg by mouth every 6 (six) hours as needed for headache.   citalopram 20 MG tablet Commonly known as:  CELEXA Take 1 tablet (20 mg total) by mouth daily.   metroNIDAZOLE 500 MG tablet Commonly known as:  FLAGYL Take 1 tablet (500 mg total) by mouth 2 (two) times daily.   ondansetron 4 MG tablet Commonly known as:  ZOFRAN Take 1 tablet (4 mg total) by mouth every 8 (eight) hours as needed for nausea or vomiting.   prenatal multivitamin Tabs tablet Take 1 tablet by mouth daily at 12 noon.       [address]lizabeth Woodland [place], [place] 11/26/2015 8:15 PM

## 2015-11-25 NOTE — Discharge Instructions (Signed)
Bacterial Vaginosis Bacterial vaginosis is a vaginal infection that occurs when the normal balance of bacteria in the vagina is disrupted. It results from an overgrowth of certain bacteria. This is the most common vaginal infection in women of childbearing age. Treatment is important to prevent complications, especially in pregnant women, as it can cause a premature delivery. CAUSES  Bacterial vaginosis is caused by an increase in harmful bacteria that are normally present in smaller amounts in the vagina. Several different kinds of bacteria can cause bacterial vaginosis. However, the reason that the condition develops is not fully understood. RISK FACTORS Certain activities or behaviors can put you at an increased risk of developing bacterial vaginosis, including:  Having a new sex partner or multiple sex partners.  Douching.  Using an intrauterine device (IUD) for contraception. Women do not get bacterial vaginosis from toilet seats, bedding, swimming pools, or contact with objects around them. SIGNS AND SYMPTOMS  Some women with bacterial vaginosis have no signs or symptoms. Common symptoms include:  Grey vaginal discharge.  A fishlike odor with discharge, especially after sexual intercourse.  Itching or burning of the vagina and vulva.  Burning or pain with urination. DIAGNOSIS  Your health care provider will take a medical history and examine the vagina for signs of bacterial vaginosis. A sample of vaginal fluid may be taken. Your health care provider will look at this sample under a microscope to check for bacteria and abnormal cells. A vaginal pH test may also be done.  TREATMENT  Bacterial vaginosis may be treated with antibiotic medicines. These may be given in the form of a pill or a vaginal cream. A second round of antibiotics may be prescribed if the condition comes back after treatment. Because bacterial vaginosis increases your risk for sexually transmitted diseases, getting  treated can help reduce your risk for chlamydia, gonorrhea, HIV, and herpes. HOME CARE INSTRUCTIONS   Only take over-the-counter or prescription medicines as directed by your health care provider.  If antibiotic medicine was prescribed, take it as directed. Make sure you finish it even if you start to feel better.  Tell all sexual partners that you have a vaginal infection. They should see their health care provider and be treated if they have problems, such as a mild rash or itching.  During treatment, it is important that you follow these instructions:  Avoid sexual activity or use condoms correctly.  Do not douche.  Avoid alcohol as directed by your health care provider.  Avoid breastfeeding as directed by your health care provider. SEEK MEDICAL CARE IF:   Your symptoms are not improving after 3 days of treatment.  You have increased discharge or pain.  You have a fever. MAKE SURE YOU:   Understand these instructions.  Will watch your condition.  Will get help right away if you are not doing well or get worse. FOR MORE INFORMATION  Centers for Disease Control and Prevention, Division of STD Prevention: SolutionApps.co.zawww.cdc.gov/std American Sexual Health Association (ASHA): www.ashastd.org    This information is not intended to replace advice given to you by your health care provider. Make sure you discuss any questions you have with your health care provider.   Document Released: 03/22/2005 Document Revised: 04/12/2014 Document Reviewed: 11/01/2012 Elsevier Interactive Patient Education 2016 [employer]   Preterm Labor Information Preterm labor is when labor starts at less than 37 weeks of pregnancy. The normal length of a pregnancy is 39 to 41 weeks. CAUSES Often, there is no identifiable underlying cause  as to why a woman goes into preterm labor. One of the most common known causes of preterm labor is infection. Infections of the uterus, cervix, vagina, amniotic sac, bladder,  kidney, or even the lungs (pneumonia) can cause labor to start. Other suspected causes of preterm labor include:  °· Urogenital infections, such as yeast infections and bacterial vaginosis.   °· Uterine abnormalities (uterine shape, uterine septum, fibroids, or bleeding from the placenta).   °· A cervix that has been operated on (it may fail to stay closed).   °· Malformations in the fetus.   °· Multiple gestations (twins, triplets, and so on).   °· Breakage of the amniotic sac.   °RISK FACTORS °· Having a previous history of preterm labor.   °· Having premature rupture of membranes (PROM).   °· Having a placenta that covers the opening of the cervix (placenta previa).   °· Having a placenta that separates from the uterus (placental abruption).   °· Having a cervix that is too weak to hold the fetus in the uterus (incompetent cervix).   °· Having too much fluid in the amniotic sac (polyhydramnios).   °· Taking illegal drugs or smoking while pregnant.   °· Not gaining enough weight while pregnant.   °· Being younger than 18 and older than 27 years old.   °· Having a low socioeconomic status.   °· Being African American. °SYMPTOMS °Signs and symptoms of preterm labor include:  °· Menstrual-like cramps, abdominal pain, or back pain. °· Uterine contractions that are regular, as frequent as six in an hour, regardless of their intensity (may be mild or painful). °· Contractions that start on the top of the uterus and spread down to the lower abdomen and back.   °· A sense of increased pelvic pressure.   °· A watery or bloody mucus discharge that comes from the vagina.   °TREATMENT °Depending on the length of the pregnancy and other circumstances, your health care provider may suggest bed rest. If necessary, there are medicines that can be given to stop contractions and to mature the fetal lungs. If labor happens before 34 weeks of pregnancy, a prolonged hospital stay may be recommended. Treatment depends on the condition  of both you and the fetus.  °WHAT SHOULD YOU DO IF YOU THINK YOU ARE IN PRETERM LABOR? °Call your health care provider right away. You will need to go to the hospital to get checked immediately. °HOW CAN YOU PREVENT PRETERM LABOR IN FUTURE PREGNANCIES? °You should:  °· Stop smoking if you smoke.  °· Maintain healthy weight gain and avoid chemicals and drugs that are not necessary. °· Be watchful for any type of infection. °· Inform your health care provider if you have a known history of preterm labor. °  °This information is not intended to replace advice given to you by your health care provider. Make sure you discuss any questions you have with your health care provider. °  °Document Released: 06/12/2003 Document Revised: 11/22/2012 Document Reviewed: 04/24/2012 °Elsevier Interactive Patient Education ©2016 Elsevier Inc. ° ° °

## 2015-11-25 NOTE — Telephone Encounter (Signed)
She is anemic.  She needs to each iron rich foods and take OTC iron supplements.  I do not think if she is having +FM (at least 10X/2hours), no bleeding and no loss of fluid that she needs an earlier appointment.  Thank you.  R.Laszlo Ellerby CNM.

## 2015-11-25 NOTE — MAU Note (Signed)
Pt c/o cramping starting at 6pm and getting progressively worse. Pt denies bleeding and leaking of fluid. Pt states baby has not been moving well today.

## 2015-11-26 LAB — GC/CHLAMYDIA PROBE AMP (~~LOC~~) NOT AT ARMC
Chlamydia: NEGATIVE
NEISSERIA GONORRHEA: NEGATIVE

## 2015-12-01 NOTE — Telephone Encounter (Signed)
Patient has an appointment in 2 days

## 2015-12-03 ENCOUNTER — Ambulatory Visit (INDEPENDENT_AMBULATORY_CARE_PROVIDER_SITE_OTHER): Payer: Medicaid Other | Admitting: Certified Nurse Midwife

## 2015-12-03 ENCOUNTER — Encounter: Payer: Self-pay | Admitting: Certified Nurse Midwife

## 2015-12-03 VITALS — BP 104/70 | HR 114 | Temp 98.7°F | Wt 141.4 lb

## 2015-12-03 DIAGNOSIS — Z3403 Encounter for supervision of normal first pregnancy, third trimester: Secondary | ICD-10-CM

## 2015-12-03 LAB — POCT URINALYSIS DIPSTICK
Bilirubin, UA: NEGATIVE
Blood, UA: NEGATIVE
GLUCOSE UA: NEGATIVE
KETONES UA: NEGATIVE
Nitrite, UA: NEGATIVE
Urobilinogen, UA: NEGATIVE
pH, UA: 7

## 2015-12-03 NOTE — Progress Notes (Signed)
Patient reports no concerns today 
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
## 2015-12-19 ENCOUNTER — Encounter: Payer: Self-pay | Admitting: Obstetrics

## 2015-12-19 ENCOUNTER — Ambulatory Visit (INDEPENDENT_AMBULATORY_CARE_PROVIDER_SITE_OTHER): Payer: Medicaid Other | Admitting: Obstetrics

## 2015-12-19 VITALS — BP 99/66 | HR 72 | Temp 98.8°F | Wt 148.5 lb

## 2015-12-19 DIAGNOSIS — Z3403 Encounter for supervision of normal first pregnancy, third trimester: Secondary | ICD-10-CM
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
## 2016-01-02 ENCOUNTER — Ambulatory Visit (INDEPENDENT_AMBULATORY_CARE_PROVIDER_SITE_OTHER): Payer: Medicaid Other | Admitting: Certified Nurse Midwife

## 2016-01-02 VITALS — BP 107/74 | HR 80 | Temp 98.0°F | Wt 152.2 lb

## 2016-01-02 DIAGNOSIS — Z3403 Encounter for supervision of normal first pregnancy, third trimester: Secondary | ICD-10-CM | POA: Diagnosis not present
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
## 2016-01-07 ENCOUNTER — Encounter: Payer: Medicaid Other | Admitting: Certified Nurse Midwife

## 2016-01-13 ENCOUNTER — Encounter: Payer: Self-pay | Admitting: Certified Nurse Midwife

## 2016-01-15 ENCOUNTER — Ambulatory Visit (INDEPENDENT_AMBULATORY_CARE_PROVIDER_SITE_OTHER): Payer: Medicaid Other | Admitting: Certified Nurse Midwife

## 2016-01-15 ENCOUNTER — Other Ambulatory Visit (HOSPITAL_COMMUNITY)
Admission: RE | Admit: 2016-01-15 | Discharge: 2016-01-15 | Disposition: A | Discharge status: Home / Self Care | Payer: Medicaid Other | Source: Ambulatory Visit | Attending: Certified Nurse Midwife | Admitting: Certified Nurse Midwife

## 2016-01-15 VITALS — BP 103/73 | HR 78 | Temp 98.0°F | Wt 154.5 lb

## 2016-01-15 DIAGNOSIS — Z3403 Encounter for supervision of normal first pregnancy, third trimester: Secondary | ICD-10-CM | POA: Insufficient documentation

## 2016-01-15 LAB — OB RESULTS CONSOLE GC/CHLAMYDIA: GC PROBE AMP, GENITAL: NEGATIVE

## 2016-01-15 LAB — OB RESULTS CONSOLE GBS: GBS: POSITIVE

## 2016-01-15 NOTE — Addendum Note (Signed)
Addended by: Francene FindersJAMES, Kensleigh Gates C on: 01/15/2016 04:00 PM   Modules accepted: Orders
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
## 2016-01-19 ENCOUNTER — Other Ambulatory Visit: Payer: Self-pay | Admitting: Certified Nurse Midwife

## 2016-01-19 DIAGNOSIS — B951 Streptococcus, group B, as the cause of diseases classified elsewhere: Secondary | ICD-10-CM

## 2016-01-19 LAB — CULTURE, BETA STREP (GROUP B ONLY): Strep Gp B Culture: POSITIVE — AB

## 2016-01-19 LAB — CERVICOVAGINAL ANCILLARY ONLY
CHLAMYDIA, DNA PROBE: NEGATIVE
NEISSERIA GONORRHEA: NEGATIVE
TRICH (WINDOWPATH): NEGATIVE

## 2016-01-21 LAB — CERVICOVAGINAL ANCILLARY ONLY
Bacterial vaginitis: NEGATIVE
Candida vaginitis: NEGATIVE

## 2016-01-26 ENCOUNTER — Ambulatory Visit (INDEPENDENT_AMBULATORY_CARE_PROVIDER_SITE_OTHER): Payer: 59 | Admitting: Family

## 2016-01-26 VITALS — BP 113/77 | HR 77 | Wt 156.0 lb

## 2016-01-26 DIAGNOSIS — F332 Major depressive disorder, recurrent severe without psychotic features: Secondary | ICD-10-CM

## 2016-01-26 DIAGNOSIS — Z23 Encounter for immunization: Secondary | ICD-10-CM | POA: Diagnosis not present

## 2016-01-26 DIAGNOSIS — O99343 Other mental disorders complicating pregnancy, third trimester: Secondary | ICD-10-CM

## 2016-01-26 DIAGNOSIS — O0973 Supervision of high risk pregnancy due to social problems, third trimester: Secondary | ICD-10-CM

## 2016-01-26 DIAGNOSIS — B951 Streptococcus, group B, as the cause of diseases classified elsewhere: Secondary | ICD-10-CM

## 2016-01-26 NOTE — Patient Instructions (Signed)
AREA PEDIATRIC/FAMILY PRACTICE PHYSICIANS  [facility] [address], [address] [place], [place]  [address] Phone - [phone]   Fax - [phone]  [facility] [address] [place], [place] [address] Phone - [phone]   Fax - [phone]  [facility] [address] [place], [place]  [address] Phone - [phone]   Fax - [phone]  [facility] [address] [place], [place]  [address] Phone - [phone]   Fax - [phone]   [facility] [address] [place], [place]  [address] Phone - [phone]   Fax - [phone]  [facility] [address], [address] [place], [place]  [address] Phone - [phone]   Fax - [phone]  [facility] [address] [place], [place]  [address] Phone - [phone]   Fax - [phone]  [facility] [address] [place], [place]  [address] Phone - [phone]   Fax - [phone]  [facility] [address] [place], [place]  [address] Phone - [phone]   Fax - [phone] [facility] [address] [place], [place]  [address] Phone - [phone]   Fax - [phone]  [facility] [address] [place], [place]  [address] Phone - [phone]   Fax - [phone]  [facility] [address] [place], [place]  [address] Phone - [phone]   Fax - [phone]  [facility] [address] [place], [place]  [address] Phone - [phone]   Fax - [phone]  [facility] [address] [place], [place]  [address] Phone - [phone]  [facility] [address] [place], [place]  [address] Phone - [phone]   Fax - [phone]  [facility] [address] [place], [place]  [address] Phone - [phone]   Fax - [phone]  [facility] [address] [place], [place]  [address] Phone - [phone]   Fax - [phone]  [facility] [address] [place], [place]  [address] Phone - [phone]   Fax - [phone]   [facility] [address] [place], [place]  [address] Phone - [phone]   Fax - [phone]  [facility] [address] [place], [place]  [address] Phone - [phone]   Fax - [phone]  [facility] RUB[facility] [address] [place], [place]  [address] Phone - [phone]   Fax - [phone]  [facility] [address] [place], [place]  [address] Phone - [phone]   Fax - [phone]  [facility] [address] , [place]  [address] Phone - [phone]   Fax - [phone] [facility] [address] [place], [place]  [address] Phone - [phone]   Fax - [phone]  [facility] [address] [place],   [address] Phone - [phone]   Fax - [phone]  [facility] [address] [place],   [address] Phone - [phone]   Fax - [phone]   
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
## 2016-01-26 NOTE — Progress Notes (Signed)
Pt would like Tdap vaccine today, declines flu vaccine. Tdap was given, pt tolerated well.

## 2016-02-03 ENCOUNTER — Encounter: Payer: Self-pay | Admitting: Obstetrics

## 2016-02-04 ENCOUNTER — Ambulatory Visit (INDEPENDENT_AMBULATORY_CARE_PROVIDER_SITE_OTHER): Payer: No Typology Code available for payment source | Admitting: Obstetrics

## 2016-02-04 ENCOUNTER — Encounter: Payer: Self-pay | Admitting: Obstetrics

## 2016-02-04 VITALS — BP 126/74 | HR 107 | Temp 97.5°F | Wt 155.6 lb

## 2016-02-04 DIAGNOSIS — Z3403 Encounter for supervision of normal first pregnancy, third trimester: Secondary | ICD-10-CM | POA: Diagnosis not present

## 2016-02-04 NOTE — Progress Notes (Signed)
Patient c/o dehydration and a lot of pelvic pain

## 2016-02-08 ENCOUNTER — Inpatient Hospital Stay (HOSPITAL_COMMUNITY)
Admission: AD | Admit: 2016-02-08 | Discharge: 2016-02-08 | Disposition: A | Discharge status: Home / Self Care | Payer: Medicaid Other | Source: Ambulatory Visit | Attending: Obstetrics and Gynecology | Admitting: Obstetrics and Gynecology

## 2016-02-08 ENCOUNTER — Encounter (HOSPITAL_COMMUNITY): Payer: Self-pay | Admitting: *Deleted

## 2016-02-08 ENCOUNTER — Inpatient Hospital Stay (HOSPITAL_COMMUNITY)
Admission: AD | Admit: 2016-02-08 | Discharge: 2016-02-11 | DRG: 775 | Disposition: A | Discharge status: Home / Self Care | Payer: Medicaid Other | Source: Ambulatory Visit | Attending: Family Medicine | Admitting: Family Medicine

## 2016-02-08 DIAGNOSIS — Z8249 Family history of ischemic heart disease and other diseases of the circulatory system: Secondary | ICD-10-CM

## 2016-02-08 DIAGNOSIS — Z87891 Personal history of nicotine dependence: Secondary | ICD-10-CM

## 2016-02-08 DIAGNOSIS — D649 Anemia, unspecified: Secondary | ICD-10-CM | POA: Diagnosis not present

## 2016-02-08 DIAGNOSIS — O99824 Streptococcus B carrier state complicating childbirth: Secondary | ICD-10-CM | POA: Diagnosis present

## 2016-02-08 DIAGNOSIS — Z3A39 39 weeks gestation of pregnancy: Secondary | ICD-10-CM

## 2016-02-08 DIAGNOSIS — O9081 Anemia of the puerperium: Secondary | ICD-10-CM | POA: Diagnosis not present

## 2016-02-08 NOTE — Discharge Instructions (Signed)
Fetal Movement Counts °Patient Name: __________________________________________________ Patient Due Date: ____________________ °Performing a fetal movement count is highly recommended in high-risk pregnancies, but it is good for every pregnant woman to do. Your health care provider may ask you to start counting fetal movements at 28 weeks of the pregnancy. Fetal movements often increase: °· After eating a full meal. °· After physical activity. °· After eating or drinking something sweet or cold. °· At rest. °Pay attention to when you feel the baby is most active. This will help you notice a pattern of your baby's sleep and wake cycles and what factors contribute to an increase in fetal movement. It is important to perform a fetal movement count at the same time each day when your baby is normally most active.  °HOW TO COUNT FETAL MOVEMENTS °1. Find a quiet and comfortable area to sit or lie down on your left side. Lying on your left side provides the best blood and oxygen circulation to your baby. °2. Write down the day and time on a sheet of paper or in a journal. °3. Start counting kicks, flutters, swishes, rolls, or jabs in a 2-hour period. You should feel at least 10 movements within 2 hours. °4. If you do not feel 10 movements in 2 hours, wait 2-3 hours and count again. Look for a change in the pattern or not enough counts in 2 hours. °SEEK MEDICAL CARE IF: °· You feel less than 10 counts in 2 hours, tried twice. °· There is no movement in over an hour. °· The pattern is changing or taking longer each day to reach 10 counts in 2 hours. °· You feel the baby is not moving as he or she usually does. °Date: ____________ Movements: ____________ Start time: ____________ Finish time: ____________  °Date: ____________ Movements: ____________ Start time: ____________ Finish time: ____________ °Date: ____________ Movements: ____________ Start time: ____________ Finish time: ____________ °Date: ____________ Movements:  ____________ Start time: ____________ Finish time: ____________ °Date: ____________ Movements: ____________ Start time: ____________ Finish time: ____________ °Date: ____________ Movements: ____________ Start time: ____________ Finish time: ____________ °Date: ____________ Movements: ____________ Start time: ____________ Finish time: ____________ °Date: ____________ Movements: ____________ Start time: ____________ Finish time: ____________  °Date: ____________ Movements: ____________ Start time: ____________ Finish time: ____________ °Date: ____________ Movements: ____________ Start time: ____________ Finish time: ____________ °Date: ____________ Movements: ____________ Start time: ____________ Finish time: ____________ °Date: ____________ Movements: ____________ Start time: ____________ Finish time: ____________ °Date: ____________ Movements: ____________ Start time: ____________ Finish time: ____________ °Date: ____________ Movements: ____________ Start time: ____________ Finish time: ____________ °Date: ____________ Movements: ____________ Start time: ____________ Finish time: ____________  °Date: ____________ Movements: ____________ Start time: ____________ Finish time: ____________ °Date: ____________ Movements: ____________ Start time: ____________ Finish time: ____________ °Date: ____________ Movements: ____________ Start time: ____________ Finish time: ____________ °Date: ____________ Movements: ____________ Start time: ____________ Finish time: ____________ °Date: ____________ Movements: ____________ Start time: ____________ Finish time: ____________ °Date: ____________ Movements: ____________ Start time: ____________ Finish time: ____________ °Date: ____________ Movements: ____________ Start time: ____________ Finish time: ____________  °Date: ____________ Movements: ____________ Start time: ____________ Finish time: ____________ °Date: ____________ Movements: ____________ Start time: ____________ Finish  time: ____________ °Date: ____________ Movements: ____________ Start time: ____________ Finish time: ____________ °Date: ____________ Movements: ____________ Start time: ____________ Finish time: ____________ °Date: ____________ Movements: ____________ Start time: ____________ Finish time: ____________ °Date: ____________ Movements: ____________ Start time: ____________ Finish time: ____________ °Date: ____________ Movements: ____________ Start time: ____________ Finish time: ____________  °Date: ____________ Movements: ____________ Start time: ____________ Finish   time: ____________ °Date: ____________ Movements: ____________ Start time: ____________ Finish time: ____________ °Date: ____________ Movements: ____________ Start time: ____________ Finish time: ____________ °Date: ____________ Movements: ____________ Start time: ____________ Finish time: ____________ °Date: ____________ Movements: ____________ Start time: ____________ Finish time: ____________ °Date: ____________ Movements: ____________ Start time: ____________ Finish time: ____________ °Date: ____________ Movements: ____________ Start time: ____________ Finish time: ____________  °Date: ____________ Movements: ____________ Start time: ____________ Finish time: ____________ °Date: ____________ Movements: ____________ Start time: ____________ Finish time: ____________ °Date: ____________ Movements: ____________ Start time: ____________ Finish time: ____________ °Date: ____________ Movements: ____________ Start time: ____________ Finish time: ____________ °Date: ____________ Movements: ____________ Start time: ____________ Finish time: ____________ °Date: ____________ Movements: ____________ Start time: ____________ Finish time: ____________ °Date: ____________ Movements: ____________ Start time: ____________ Finish time: ____________  °Date: ____________ Movements: ____________ Start time: ____________ Finish time: ____________ °Date: ____________  Movements: ____________ Start time: ____________ Finish time: ____________ °Date: ____________ Movements: ____________ Start time: ____________ Finish time: ____________ °Date: ____________ Movements: ____________ Start time: ____________ Finish time: ____________ °Date: ____________ Movements: ____________ Start time: ____________ Finish time: ____________ °Date: ____________ Movements: ____________ Start time: ____________ Finish time: ____________ °Date: ____________ Movements: ____________ Start time: ____________ Finish time: ____________  °Date: ____________ Movements: ____________ Start time: ____________ Finish time: ____________ °Date: ____________ Movements: ____________ Start time: ____________ Finish time: ____________ °Date: ____________ Movements: ____________ Start time: ____________ Finish time: ____________ °Date: ____________ Movements: ____________ Start time: ____________ Finish time: ____________ °Date: ____________ Movements: ____________ Start time: ____________ Finish time: ____________ °Date: ____________ Movements: ____________ Start time: ____________ Finish time: ____________ °  °This information is not intended to replace advice given to you by your health care provider. Make sure you discuss any questions you have with your health care provider. °  °Document Released: 04/21/2006 Document Revised: 04/12/2014 Document Reviewed: 01/17/2012 °Elsevier Interactive Patient Education ©2016 [employer] °Vaginal Delivery °During delivery, your health care provider will help you give birth to your baby. During a vaginal delivery, you will work to push the baby out of your vagina. However, before you can push your baby out, a few things need to happen. The opening of your uterus (cervix) has to soften, thin out, and open up (dilate) all the way to 10 cm. Also, your baby has to move down from the uterus into your vagina.  °SIGNS OF LABOR  °Your health care provider will first need to make sure you  are in labor. Signs of labor include:  °· Passing what is called the mucous plug before labor begins. This is a small amount of blood-stained mucus. °· Having regular, painful uterine contractions.   °· The time between contractions gets shorter.   °· The discomfort and pain gradually get more intense. °· Contraction pains get worse when walking and do not go away when resting.   °· Your cervix becomes thinner (effacement) and dilates. °BEFORE THE DELIVERY °Once you are in labor and admitted into the hospital or care center, your health care provider may do the following:  °· Perform a complete physical exam. °· Review any complications related to pregnancy or labor.  °· Check your blood pressure, pulse, temperature, and heart rate (vital signs).   °· Determine if, and when, the rupture of amniotic membranes occurred. °· Do a vaginal exam (using a sterile glove and lubricant) to determine:   °¨ The position (presentation) of the baby. Is the baby's head presenting first (vertex) in the birth canal (vagina), or are the feet or buttocks first (breech)?   °¨ The level (station) of the baby's head within   the birth canal.   °¨ The effacement and dilatation of the cervix.   °· An electronic fetal monitor is usually placed on your abdomen when you first arrive. This is used to monitor your contractions and the baby's heart rate. °¨ When the monitor is on your abdomen (external fetal monitor), it can only pick up the frequency and length of your contractions. It cannot tell the strength of your contractions. °¨ If it becomes necessary for your health care provider to know exactly how strong your contractions are or to see exactly what the baby's heart rate is doing, an internal monitor may be inserted into your vagina and uterus. Your health care provider will discuss the benefits and risks of using an internal monitor and obtain your permission before inserting the device. °¨ Continuous fetal monitoring may be needed if  you have an epidural, are receiving certain medicines (such as oxytocin), or have pregnancy or labor complications. °· An IV access tube may be placed into a vein in your arm to deliver fluids and medicines if necessary. °THREE STAGES OF LABOR AND DELIVERY °Normal labor and delivery is divided into three stages. °First Stage °This stage starts when you begin to contract regularly and your cervix begins to efface and dilate. It ends when your cervix is completely open (fully dilated). The first stage is the longest stage of labor and can last from 3 hours to 15 hours.  °Several methods are available to help with labor pain. You and your health care provider will decide which option is best for you. Options include:  °· Opioid medicines. These are strong pain medicines that you can get through your IV tube or as a shot into your muscle. These medicines lessen pain but do not make it go away completely.  °· Epidural. A medicine is given through a thin tube that is inserted in your back. The medicine numbs the lower part of your body and prevents any pain in that area. °· Paracervical pain medicine. This is an injection of an anesthetic on each side of your cervix.   °· You may request natural childbirth, which does not involve the use of pain medicines or an epidural during labor and delivery. Instead, you will use other things, such as breathing exercises, to help cope with the pain. °Second Stage °The second stage of labor begins when your cervix is fully dilated at 10 cm. It continues until you push your baby down through the birth canal and the baby is born. This stage can take only minutes or several hours. °· The location of your baby's head as it moves through the birth canal is reported as a number called a station. If the baby's head has not started its descent, the station is described as being at minus 3 (-3). When your baby's head is at the zero station, it is at the middle of the birth canal and is engaged  in the pelvis. The station of your baby helps indicate the progress of the second stage of labor. °· When your baby is born, your health care provider may hold the baby with his or her head lowered to prevent amniotic fluid, mucus, and blood from getting into the baby's lungs. The baby's mouth and nose may be suctioned with a small bulb syringe to remove any additional fluid. °· Your health care provider may then place the baby on your stomach. It is important to keep the baby from getting cold. To do this, the health care provider will dry   the baby off, place the baby directly on your skin (with no blankets between you and the baby), and cover the baby with warm, dry blankets.   °· The umbilical cord is cut. °Third Stage °During the third stage of labor, your health care provider will deliver the placenta (afterbirth) and make sure your bleeding is under control. The delivery of the placenta usually takes about 5 minutes but can take up to 30 minutes. After the placenta is delivered, a medicine may be given either by IV or injection to help contract the uterus and control bleeding. If you are planning to breastfeed, you can try to do so now. °After you deliver the placenta, your uterus should contract and get very firm. If your uterus does not remain firm, your health care provider will massage it. This is important because the contraction of the uterus helps cut off bleeding at the site where the placenta was attached to your uterus. If your uterus does not contract properly and stay firm, you may continue to bleed heavily. If there is a lot of bleeding, medicines may be given to contract the uterus and stop the bleeding.  °  °This information is not intended to replace advice given to you by your health care provider. Make sure you discuss any questions you have with your health care provider. °  °Document Released: 12/30/2007 Document Revised: 04/12/2014 Document Reviewed: 11/17/2011 °[employer]vier Interactive  Patient Education ©2016 [employer] ° °

## 2016-02-08 NOTE — MAU Note (Signed)
Pt presents to MAU with complaints of contractions every 5 minutes toady. Denies any vaginal bleeding or LOF. Reports baby is active.

## 2016-02-09 ENCOUNTER — Encounter (HOSPITAL_COMMUNITY): Payer: Self-pay | Admitting: Anesthesiology

## 2016-02-09 ENCOUNTER — Inpatient Hospital Stay (HOSPITAL_COMMUNITY): Payer: Medicaid Other | Admitting: Anesthesiology

## 2016-02-09 ENCOUNTER — Encounter (HOSPITAL_COMMUNITY): Payer: Self-pay | Admitting: *Deleted

## 2016-02-09 DIAGNOSIS — Z87891 Personal history of nicotine dependence: Secondary | ICD-10-CM | POA: Diagnosis not present

## 2016-02-09 DIAGNOSIS — Z8249 Family history of ischemic heart disease and other diseases of the circulatory system: Secondary | ICD-10-CM | POA: Diagnosis not present

## 2016-02-09 DIAGNOSIS — O9081 Anemia of the puerperium: Secondary | ICD-10-CM | POA: Diagnosis not present

## 2016-02-09 DIAGNOSIS — Z3A39 39 weeks gestation of pregnancy: Secondary | ICD-10-CM | POA: Diagnosis not present

## 2016-02-09 DIAGNOSIS — O99824 Streptococcus B carrier state complicating childbirth: Secondary | ICD-10-CM | POA: Diagnosis not present

## 2016-02-09 DIAGNOSIS — Z3403 Encounter for supervision of normal first pregnancy, third trimester: Secondary | ICD-10-CM | POA: Diagnosis present

## 2016-02-09 DIAGNOSIS — D649 Anemia, unspecified: Secondary | ICD-10-CM | POA: Diagnosis not present

## 2016-02-09 LAB — CBC
HCT: 30.4 % — ABNORMAL LOW (ref 36.0–46.0)
Hemoglobin: 10.1 g/dL — ABNORMAL LOW (ref 12.0–15.0)
MCH: 27.3 pg (ref 26.0–34.0)
MCHC: 33.2 g/dL (ref 30.0–36.0)
MCV: 82.2 fL (ref 78.0–100.0)
Platelets: 203 10*3/uL (ref 150–400)
RBC: 3.7 MIL/uL — ABNORMAL LOW (ref 3.87–5.11)
RDW: 15.5 % (ref 11.5–15.5)
WBC: 14.1 10*3/uL — ABNORMAL HIGH (ref 4.0–10.5)

## 2016-02-09 LAB — TYPE AND SCREEN
ABO/RH(D): A POS
ANTIBODY SCREEN: NEGATIVE

## 2016-02-09 LAB — RPR: RPR Ser Ql: NONREACTIVE

## 2016-02-09 LAB — ABO/RH: ABO/RH(D): A POS

## 2016-02-09 MED ORDER — ONDANSETRON HCL 4 MG PO TABS: 4.0000 mg | ORAL_TABLET | ORAL | Status: DC | PRN

## 2016-02-09 MED ORDER — DEXTROSE 5 % IV SOLN
5.0000 10*6.[IU] | Freq: Once | INTRAVENOUS | Status: AC
  Administered 2016-02-09: 5 10*6.[IU] via INTRAVENOUS
  Filled 2016-02-09: qty 5

## 2016-02-09 MED ORDER — ZOLPIDEM TARTRATE 5 MG PO TABS: 5.0000 mg | ORAL_TABLET | Freq: Every evening | ORAL | Status: DC | PRN

## 2016-02-09 MED ORDER — LACTATED RINGERS IV SOLN
500.0000 mL | Freq: Once | INTRAVENOUS | Status: AC
  Administered 2016-02-09: 500 mL via INTRAVENOUS

## 2016-02-09 MED ORDER — DIPHENHYDRAMINE HCL 25 MG PO CAPS: 25.0000 mg | ORAL_CAPSULE | Freq: Four times a day (QID) | ORAL | Status: DC | PRN

## 2016-02-09 MED ORDER — LIDOCAINE HCL (PF) 1 % IJ SOLN
30.0000 mL | INTRAMUSCULAR | Status: DC | PRN
  Filled 2016-02-09: qty 30

## 2016-02-09 MED ORDER — PHENYLEPHRINE 40 MCG/ML (10ML) SYRINGE FOR IV PUSH (FOR BLOOD PRESSURE SUPPORT)
80.0000 ug | PREFILLED_SYRINGE | INTRAVENOUS | Status: DC | PRN
  Filled 2016-02-09: qty 5

## 2016-02-09 MED ORDER — OXYCODONE-ACETAMINOPHEN 5-325 MG PO TABS
1.0000 | ORAL_TABLET | ORAL | Status: DC | PRN
  Administered 2016-02-10 – 2016-02-11 (×4): 1 via ORAL
  Filled 2016-02-09 (×4): qty 1

## 2016-02-09 MED ORDER — SENNOSIDES-DOCUSATE SODIUM 8.6-50 MG PO TABS
2.0000 | ORAL_TABLET | ORAL | Status: DC
  Administered 2016-02-09 – 2016-02-10 (×3): 2 via ORAL
  Filled 2016-02-09 (×4): qty 2

## 2016-02-09 MED ORDER — SIMETHICONE 80 MG PO CHEW: 80.0000 mg | CHEWABLE_TABLET | ORAL | Status: DC | PRN

## 2016-02-09 MED ORDER — PRENATAL MULTIVITAMIN CH
1.0000 | ORAL_TABLET | Freq: Every day | ORAL | Status: DC
  Administered 2016-02-10 – 2016-02-11 (×2): 1 via ORAL
  Filled 2016-02-09 (×2): qty 1

## 2016-02-09 MED ORDER — PROMETHAZINE HCL 25 MG/ML IJ SOLN
12.5000 mg | Freq: Once | INTRAMUSCULAR | Status: AC
  Administered 2016-02-09: 12.5 mg via INTRAMUSCULAR
  Filled 2016-02-09: qty 1

## 2016-02-09 MED ORDER — COCONUT OIL OIL
1.0000 "application " | TOPICAL_OIL | Status: DC | PRN
  Administered 2016-02-10: 1 via TOPICAL
  Filled 2016-02-09: qty 120

## 2016-02-09 MED ORDER — SOD CITRATE-CITRIC ACID 500-334 MG/5ML PO SOLN: 30.0000 mL | ORAL | Status: DC | PRN

## 2016-02-09 MED ORDER — LACTATED RINGERS IV SOLN
500.0000 mL | INTRAVENOUS | Status: DC | PRN
  Administered 2016-02-09: 250 mL via INTRAVENOUS

## 2016-02-09 MED ORDER — BENZOCAINE-MENTHOL 20-0.5 % EX AERO
1.0000 "application " | INHALATION_SPRAY | CUTANEOUS | Status: DC | PRN
  Administered 2016-02-09: 1 via TOPICAL
  Filled 2016-02-09 (×2): qty 56

## 2016-02-09 MED ORDER — TETANUS-DIPHTH-ACELL PERTUSSIS 5-2.5-18.5 LF-MCG/0.5 IM SUSP: 0.5000 mL | Freq: Once | INTRAMUSCULAR | Status: DC

## 2016-02-09 MED ORDER — ONDANSETRON HCL 4 MG/2ML IJ SOLN: 4.0000 mg | INTRAMUSCULAR | Status: DC | PRN

## 2016-02-09 MED ORDER — ACETAMINOPHEN 325 MG PO TABS: 650.0000 mg | ORAL_TABLET | ORAL | Status: DC | PRN

## 2016-02-09 MED ORDER — TERBUTALINE SULFATE 1 MG/ML IJ SOLN
0.2500 mg | Freq: Once | INTRAMUSCULAR | Status: DC | PRN
  Filled 2016-02-09: qty 1

## 2016-02-09 MED ORDER — MORPHINE SULFATE (PF) 4 MG/ML IV SOLN
4.0000 mg | Freq: Once | INTRAVENOUS | Status: AC
  Administered 2016-02-09: 4 mg via INTRAMUSCULAR
  Filled 2016-02-09: qty 1

## 2016-02-09 MED ORDER — CITALOPRAM HYDROBROMIDE 20 MG PO TABS
20.0000 mg | ORAL_TABLET | Freq: Every day | ORAL | Status: DC
  Administered 2016-02-09 – 2016-02-10 (×2): 20 mg via ORAL
  Filled 2016-02-09 (×3): qty 1

## 2016-02-09 MED ORDER — EPHEDRINE 5 MG/ML INJ
10.0000 mg | INTRAVENOUS | Status: DC | PRN
  Filled 2016-02-09: qty 4

## 2016-02-09 MED ORDER — DIBUCAINE 1 % RE OINT: 1.0000 "application " | TOPICAL_OINTMENT | RECTAL | Status: DC | PRN

## 2016-02-09 MED ORDER — LIDOCAINE HCL (PF) 1 % IJ SOLN
INTRAMUSCULAR | Status: AC | PRN
  Administered 2016-02-09 (×2): 4 mL via EPIDURAL

## 2016-02-09 MED ORDER — ONDANSETRON HCL 4 MG/2ML IJ SOLN: 4.0000 mg | Freq: Four times a day (QID) | INTRAMUSCULAR | Status: DC | PRN

## 2016-02-09 MED ORDER — OXYTOCIN 40 UNITS IN LACTATED RINGERS INFUSION - SIMPLE MED: 2.5000 [IU]/h | INTRAVENOUS | Status: DC

## 2016-02-09 MED ORDER — FENTANYL 2.5 MCG/ML BUPIVACAINE 1/10 % EPIDURAL INFUSION (WH - ANES)
14.0000 mL/h | INTRAMUSCULAR | Status: DC | PRN
  Administered 2016-02-09 (×2): 14 mL/h via EPIDURAL
  Filled 2016-02-09 (×2): qty 100

## 2016-02-09 MED ORDER — IBUPROFEN 600 MG PO TABS
600.0000 mg | ORAL_TABLET | Freq: Four times a day (QID) | ORAL | Status: DC
  Administered 2016-02-09 – 2016-02-11 (×9): 600 mg via ORAL
  Filled 2016-02-09 (×9): qty 1

## 2016-02-09 MED ORDER — OXYTOCIN BOLUS FROM INFUSION
500.0000 mL | Freq: Once | INTRAVENOUS | Status: AC
Start: 2016-02-09 — End: 2016-02-09
  Administered 2016-02-09: 500 mL via INTRAVENOUS

## 2016-02-09 MED ORDER — CEFAZOLIN IN D5W 1 GM/50ML IV SOLN
1.0000 g | Freq: Once | INTRAVENOUS | Status: AC
  Administered 2016-02-09: 1 g via INTRAVENOUS
  Filled 2016-02-09: qty 50

## 2016-02-09 MED ORDER — PENICILLIN G POTASSIUM 5000000 UNITS IJ SOLR
2.5000 10*6.[IU] | INTRAVENOUS | Status: DC
  Administered 2016-02-09: 2.5 10*6.[IU] via INTRAVENOUS
  Filled 2016-02-09 (×5): qty 2.5

## 2016-02-09 MED ORDER — OXYTOCIN 40 UNITS IN LACTATED RINGERS INFUSION - SIMPLE MED
1.0000 m[IU]/min | INTRAVENOUS | Status: DC
  Administered 2016-02-09: 1 m[IU]/min via INTRAVENOUS
  Filled 2016-02-09: qty 1000

## 2016-02-09 MED ORDER — OXYCODONE-ACETAMINOPHEN 5-325 MG PO TABS: 2.0000 | ORAL_TABLET | ORAL | Status: DC | PRN

## 2016-02-09 MED ORDER — LACTATED RINGERS IV SOLN
INTRAVENOUS | Status: DC
  Administered 2016-02-09 (×2): via INTRAVENOUS

## 2016-02-09 MED ORDER — WITCH HAZEL-GLYCERIN EX PADS: 1.0000 "application " | MEDICATED_PAD | CUTANEOUS | Status: DC | PRN

## 2016-02-09 MED ORDER — DIPHENHYDRAMINE HCL 50 MG/ML IJ SOLN
12.5000 mg | INTRAMUSCULAR | Status: DC | PRN
  Filled 2016-02-09: qty 1

## 2016-02-09 NOTE — Anesthesia Procedure Notes (Signed)
Epidural Patient location during procedure: OB Start time: 02/09/2016 7:33 AM End time: 02/09/2016 7:48 AM  Staffing Anesthesiologist: Mal AmabileFOSTER, [place] Performed: anesthesiologist   Preanesthetic Checklist Completed: patient identified, site marked, surgical consent, pre-op evaluation, timeout performed, IV checked, risks and benefits discussed and monitors and equipment checked  Epidural Patient position: sitting Prep: site prepped and draped and DuraPrep Patient monitoring: continuous pulse ox and blood pressure Approach: midline Location: L4-L5 Injection technique: LOR air  Needle:  Needle type: Tuohy  Needle gauge: 17 G Needle length: 9 cm and 9 Needle insertion depth: 5 cm cm Catheter type: closed end flexible Catheter size: 19 Gauge Catheter at skin depth: 10 cm Test dose: negative and Other  Assessment Events: blood not aspirated, injection not painful, no injection resistance, negative IV test and no paresthesia  Additional Notes Patient identified. Risks and benefits discussed including failed block, incomplete  Pain control, post dural puncture headache, nerve damage, paralysis, blood pressure Changes, nausea, vomiting, reactions to medications-both toxic and allergic and post Partum back pain. All questions were answered. Patient expressed understanding and wished to proceed. Sterile technique was used throughout procedure. Epidural site was Dressed with sterile barrier dressing. No paresthesias, signs of intravascular injection Or signs of intrathecal spread were encountered. Attempt x 2 due to poor position. Patient was more comfortable after the epidural was dosed. Please see RN's note for documentation of vital signs and FHR which are stable.

## 2016-02-09 NOTE — Anesthesia Preprocedure Evaluation (Signed)
Anesthesia Evaluation  Patient identified by MRN, date of birth, ID band Patient awake    Reviewed: Allergy & Precautions, H&P , Patient's Chart, lab work & pertinent test results  Airway Mallampati: III  TM Distance: >3 FB Neck ROM: full    Dental no notable dental hx. (+) Teeth Intact   Pulmonary neg pulmonary ROS, former smoker,    Pulmonary exam normal breath sounds clear to auscultation       Cardiovascular negative cardio ROS   Rhythm:regular Rate:Normal     Neuro/Psych  Headaches, PSYCHIATRIC DISORDERS Depression Hx/o Bulemia   GI/Hepatic negative GI ROS, Neg liver ROS, (+)     substance abuse  alcohol use,   Endo/Other  negative endocrine ROS  Renal/GU negative Renal ROS  negative genitourinary   Musculoskeletal negative musculoskeletal ROS (+)   Abdominal   Peds  Hematology  (+) anemia ,   Anesthesia Other Findings   Reproductive/Obstetrics (+) Pregnancy                             Lab Results  Component Value Date   WBC 14.1 (H) 02/09/2016   HGB 10.1 (L) 02/09/2016   HCT 30.4 (L) 02/09/2016   MCV 82.2 02/09/2016   PLT 203 02/09/2016    Anesthesia Physical Anesthesia Plan  ASA: II  Anesthesia Plan: Epidural   Post-op Pain Management:    Induction:   Airway Management Planned: Natural Airway  Additional Equipment:   Intra-op Plan:   Post-operative Plan:   Informed Consent: I have reviewed the patients History and Physical, chart, labs and discussed the procedure including the risks, benefits and alternatives for the proposed anesthesia with the patient or authorized representative who has indicated his/her understanding and acceptance.     Plan Discussed with: Anesthesiologist  Anesthesia Plan Comments:         Anesthesia Quick Evaluation
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
## 2016-02-09 NOTE — Progress Notes (Signed)
Patient seen. Having prolonged decelerations after SROM, epidural. Patient with no other complaints. More comfortable with epidural. Deceleration improved with repositioning. Continue to monitor closely. Expect vaginal delivery.

## 2016-02-09 NOTE — Anesthesia Postprocedure Evaluation (Signed)
Anesthesia Post Note  Patient: Julia Webb  Procedure(s) Performed: * No procedures listed *  Patient location during evaluation: Mother Baby Anesthesia Type: Epidural Level of consciousness: awake and alert Pain management: pain level controlled Vital Signs Assessment: post-procedure vital signs reviewed and stable Respiratory status: spontaneous breathing Cardiovascular status: blood pressure returned to baseline and stable Postop Assessment: no backache, patient able to bend at knees, no signs of nausea or vomiting, adequate PO intake and no headache Anesthetic complications: no     Last Vitals:  Vitals:   02/09/16 1504 02/09/16 1623  BP: 94/60 113/71  Pulse: 89 62  Resp: 19 20  Temp: 36.9 C 37 C    Last Pain:  Vitals:   02/09/16 1625  TempSrc:   PainSc: 0-No pain   Pain Goal:                 Amear Strojny

## 2016-02-09 NOTE — Lactation Note (Signed)
This note was copied from a baby's chart. Lactation Consultation Note  Baby has a low CBG and an attempt was made to latch baby following spoon feeding. He was placed in a laid back BF position and navigated toward the breast.  He was alert and rooting. He mouthed the nipple but did not attach. He fell asleep skin to skin on his mom.  Follow-up tomorrow or sooner if needed.  Patient Name: Julia Murrell Converseoneka Manthe RUEAV'WToday's Date: 02/09/2016 Reason for consult: Initial assessment   Maternal Data    Feeding Feeding Type: Breast Fed Length of feed: 0 min  LATCH Score/Interventions Latch: Too sleepy or reluctant, no latch achieved, no sucking elicited. Intervention(s): Assist with latch;Adjust position  Audible Swallowing: None Intervention(s): Skin to skin  Type of Nipple: Everted at rest and after stimulation  Comfort (Breast/Nipple): Soft / non-tender     Hold (Positioning): Assistance needed to correctly position infant at breast and maintain latch.  LATCH Score: 5  Lactation Tools Discussed/Used     Consult Status Consult Status: Follow-up Date: 02/10/16 Follow-up type: In-patient    Julia Webb, Julia Webb 02/09/2016, 4:15 PM

## 2016-02-10 NOTE — Lactation Note (Signed)
This note was copied from a baby's chart. Lactation Consultation Note  Patient Name: Julia Webb ZOXWR'UToday's Date: 02/10/2016 Reason for consult: Follow-up assessment   With this mom of a term baby, SGA, weight just over 5 pounds at 5 lbs 1.3 oz. Baby too sleepy to breast feed, so I set up DEP, and instructed mom to pump every 3 hours, followed by hand expression, 15 minutes, in initiation setting.  I started baby bottle feeding Alimentum, with mom's permission. He was very uncoordinated, tongue thrusting and gagging, but did get into a good rhythm at one p[oint, and took a totat of 11 ml's, and tolerated well.  Mom knows to try and RF every 3 hours, then bottle feed EBM prior to formula, and then pump. Mom knows to call for questions/conerns. [facility].   Maternal Data    Feeding Feeding Type: Formula Length of feed: 15 min  LATCH Score/Interventions Latch: Too sleepy or reluctant, no latch achieved, no sucking elicited. Intervention(s): Skin to skin;Waking techniques  Audible Swallowing: None  Type of Nipple: Everted at rest and after stimulation  Comfort (Breast/Nipple): Soft / non-tender     Hold (Positioning): Assistance needed to correctly position infant at breast and maintain latch. Intervention(s): Breastfeeding basics reviewed;Support Pillows;Position options;Skin to skin  LATCH Score: 5  Lactation Tools Discussed/Used WIC Program: Yes Pump Review: Setup, frequency, and cleaning;Milk Storage;Other (comment) (pump use and ssettings, HE) Initiated by:: chrsitine Brigitta Pricer Rn IBCLC   Consult Status Consult Status: Follow-up Date: 02/11/16 Follow-up type: In-patient    [name], [name] Anne 02/10/2016, 3:46 PM

## 2016-02-10 NOTE — Progress Notes (Signed)
Post Partum Day #1 Subjective: no complaints, up ad lib, voiding and tolerating PO  Objective: Blood pressure 100/65, pulse 61, temperature 98.2 F (36.8 C), temperature source Oral, resp. rate 18, height 5\' 5"  (1.651 m), weight 159 lb (72.1 kg), last menstrual period 05/06/2015, SpO2 96 %, unknown if currently breastfeeding.  Physical Exam:  General: alert, cooperative and no distress Lochia: appropriate Uterine Fundus: firm Incision: no significant drainage, no dehiscence, right labial hematoma: stable DVT Evaluation: No evidence of DVT seen on physical exam. No cords or calf tenderness. No significant calf/ankle edema.   Recent Labs  02/09/16 0245  HGB 10.1*  HCT 30.4*    Assessment/Plan: Plan for discharge tomorrow, Breastfeeding, Lactation consult and Contraception planning POP   LOS: 1 day   Roe CoombsRachelle A Denney, CNM 02/10/2016, 8:24 AM

## 2016-02-10 NOTE — Clinical Social Work Maternal (Signed)
CLINICAL SOCIAL WORK MATERNAL/CHILD NOTE  Patient Details  Name: Julia Webb MRN: [id number] Date of Birth: 08/19/1990  Date:  02/10/2016  Clinical Social Worker Initiating Note:  Julia Webb Date/ Time Initiated:  02/10/16/1613     Child's Name:  Julia Webb   Legal Guardian:  Mother   Need for Interpreter:  None   Date of Referral:  02/10/16     Reason for Referral:  Behavioral Health Issues, including SI , Current Substance Use/Substance Use During Pregnancy    Referral Source:  Central Nursery   Address:  [address] [place] [address]  Phone number:  [phone]   Household Members:  Self, Parents, Siblings, Significant Other   Natural Supports (not living in the home):  Immediate Family, Parent, Spouse/significant other   Professional Supports: Case Manager/Social Worker (hx of CPS involvement. No open case at this time. )   Employment: Unemployed   Type of Work:     Education:  Vocation/technical training   Financial Resources:  Medicaid   Other Resources:  WIC   Cultural/Religious Considerations Which May Impact Care:  None Reported  Strengths:  Ability to meet basic needs , Pediatrician chosen , Home prepared for child , Understanding of illness   Risk Factors/Current Problems:  Mental Health Concerns , Substance Use    Cognitive State:  Alert , Able to Concentrate , Linear Thinking , Insightful    Mood/Affect:  Bright , Calm , Comfortable , Interested    CSW Assessment:  CSW met with Julia Webb to complete an assessment for hx of substance use during pregnancy and hx of depression. Julia Webb was attentive to infant when CSW arrived as evident by Julia Webb engaging in skin-to-skin and attempting to breastfeed.  Julia Webb was inviting, polite, and interested in meeting with CSW.  CSW inquired about Julia Webb's SA hx and Julia Webb acknowledged the use of marijuana and opiates during pregnancy.  Julia Webb reported that Julia Webb had a prescription for Julia Webb's opioids, however,  Julia Webb misused them. Julia Webb reported that Julia Webb was hospitalized at [facility] for SI and overdosing on Julia Webb's opioids (10/20/15).  Julia Webb stated that Julia Webb was feeling overwhelmed and stressed about life and Julia Webb's relationship with FOB (Julia Webb). CSW assessed for SI and HI and Julia Webb denied both.  Julia Webb is being seen at [facility] by clinician Julia Webb, and has been instructed to call and get an appointment after Julia Webb is discharged from the hospital. Julia Webb also denies any substance use since Julia Webb's hospitalization in July 2017.  CSW informed Julia Webb of the hospital's drug screen policy, and informed Julia Webb of the 2 screenings for the infant. Julia Webb expressed that Julia Webb was not concerned and was adamantly that the Julia Webb has not utilized any substance since July 2017.  CSW explained to Julia Webb if either screen are positive for the infant, CSW would make a report to [place] CPS; Julia Webb understood. CSW educated Julia Webb about PPD. CSW informed Julia Webb of supports and interventions to decrease PPD.  CSW also encouraged Julia Webb to seek medical attention if needed for increased signs or symptoms for PPD. CSW thanked Julia Webb for meeting with CSW and provided Julia Webb with CSW contact information.  CSW encouraged Julia Webb to contact CSW if Julia Webb had any questions, needs, or concerns.  CSW Plan/Description:  No Further Intervention Required/No Barriers to Discharge, Patient/Family Education , Information/Referral to Community Resources  (CPS Julia Webb monitor infant's UDS and Cord and Julia Webb make a report to [place] CPS)   Julia Webb, MSW, LCSW Clinical Social Work [phone]    Julia Webb   Julia BOYD-GILYARD, LCSW 2015/05/28, 4:21 PM

## 2016-02-11 MED ORDER — IBUPROFEN 600 MG PO TABS: 600.0000 mg | ORAL_TABLET | Freq: Four times a day (QID) | ORAL | 2 refills | Status: DC

## 2016-02-11 MED ORDER — OXYCODONE-ACETAMINOPHEN 5-325 MG PO TABS: 1.0000 | ORAL_TABLET | ORAL | 0 refills | Status: DC | PRN

## 2016-02-11 MED ORDER — FUSION PLUS PO CAPS: 1.0000 | ORAL_CAPSULE | Freq: Every day | ORAL | 2 refills | Status: DC

## 2016-02-11 MED ORDER — SENNOSIDES-DOCUSATE SODIUM 8.6-50 MG PO TABS: 2.0000 | ORAL_TABLET | Freq: Two times a day (BID) | ORAL | 2 refills | Status: DC

## 2016-02-11 MED ORDER — BENZOCAINE-MENTHOL 20-0.5 % EX AERO: 1.0000 "application " | INHALATION_SPRAY | CUTANEOUS | 0 refills | Status: DC | PRN

## 2016-02-11 NOTE — Discharge Summary (Signed)
Obstetric Discharge Summary Reason for Admission: onset of labor and @39 .6 weeks Prenatal Procedures: ultrasound Intrapartum Procedures: spontaneous vaginal delivery Postpartum Procedures: none Complications-Operative and Postpartum: 1st degree labial lac with left labial hematoma laceration Hemoglobin  Date Value Ref Range Status  02/09/2016 10.1 (L) 12.0 - 15.0 g/dL Final   HCT  Date Value Ref Range Status  02/09/2016 30.4 (L) 36.0 - 46.0 % Final   Hematocrit  Date Value Ref Range Status  11/19/2015 32.3 (L) 34.0 - 46.6 % Final    Physical Exam:  General: alert, cooperative and no distress Lochia: appropriate Uterine Fundus: firm Incision: no significant drainage, no dehiscence DVT Evaluation: No evidence of DVT seen on physical exam. No cords or calf tenderness. No significant calf/ankle edema.  Discharge Diagnoses: Term Pregnancy-delivered. Anemia.   Discharge Information: Date: 02/11/2016 Activity: pelvic rest Diet: routine Medications: PNV, Ibuprofen, Colace, Iron and Percocet Condition: stable Instructions: refer to practice specific booklet Discharge to: home Follow-up Information    Aricka Goldberger A Rashawn Rolon, CNM Follow up in 4 week(s).   Specialty:  Certified Nurse Midwife Contact information: [address] [place] [id number] [phone]           Newborn Data: Live born female  Birth Weight: 5 lb 2.5 oz (2340 g) APGAR: 8, 8  Home with mother.  Roe Coombsachelle A Lavonne Cass, CNM 02/11/2016, 8:16 AM

## 2016-02-11 NOTE — Lactation Note (Signed)
This note was copied from a baby's chart. Lactation Consultation Note  Patient Name: Boy Murrell Converseoneka Thibeau ZOXWR'UToday's Date: 02/11/2016  Follow up visit made.  Mom states baby is latching better but still falling asleep at breast easily.  Waking techniques and breast massage/compression reviewed.  Mom is also spoon feeding hand expressed colostrum and supplementing with formula due to small size.  Discussed milk coming to volume and engorgement treatment.  Outpatient lactation support and services reviewed and encouraged.   Maternal Data    Feeding    LATCH Score/Interventions                      Lactation Tools Discussed/Used     Consult Status      Huston FoleyMOULDEN, Johney Perotti S 02/11/2016, 11:28 AM

## 2016-02-11 NOTE — Progress Notes (Signed)
Post Partum Day #2 Subjective: no complaints, up ad lib, voiding, tolerating PO and no BM.  States that breast feeding is improved.   Objective: Blood pressure 119/78, pulse 75, temperature 98.6 F (37 C), temperature source Oral, resp. rate 17, height 5\' 5"  (1.651 m), weight 159 lb (72.1 kg), last menstrual period 05/06/2015, SpO2 96 %, unknown if currently breastfeeding.  Physical Exam:  General: alert, cooperative and no distress Lochia: appropriate Uterine Fundus: firm Incision: no significant drainage, no dehiscence, no significant erythema DVT Evaluation: No evidence of DVT seen on physical exam. No cords or calf tenderness. No significant calf/ankle edema.   Recent Labs  02/09/16 0245  HGB 10.1*  HCT 30.4*    Assessment/Plan: Discharge home, Breastfeeding and Contraception POP planned   LOS: 2 days   Roe CoombsRachelle A Olimpia Tinch, CNM 02/11/2016, 8:12 AM

## 2016-02-12 ENCOUNTER — Encounter: Payer: Self-pay | Admitting: Certified Nurse Midwife

## 2016-03-25 ENCOUNTER — Telehealth: Payer: Self-pay

## 2016-03-25 ENCOUNTER — Other Ambulatory Visit: Payer: Self-pay | Admitting: Certified Nurse Midwife

## 2016-03-25 DIAGNOSIS — R252 Cramp and spasm: Principal | ICD-10-CM

## 2016-03-25 DIAGNOSIS — O9089 Other complications of the puerperium, not elsewhere classified: Secondary | ICD-10-CM

## 2016-03-25 MED ORDER — CYCLOBENZAPRINE HCL 10 MG PO TABS: 10.0000 mg | ORAL_TABLET | Freq: Three times a day (TID) | ORAL | 1 refills | Status: DC | PRN

## 2016-03-25 NOTE — Telephone Encounter (Signed)
She needs a postpartum visit, she is 6 weeks out.  We cannot give her more refills of the oxycodone at this point.  She had 120 tabs of motrin.  We could try some flexeril and I will send an Rx. Thank you.  R.Sumayya Muha CNM

## 2016-03-25 NOTE — Telephone Encounter (Signed)
Returned call and patient stated that she has been having back and stomach pains that are aggravated by movement. Pt states that she has trouble going up and down stairs, denies cramping and bleeding. States that she was taking oxycodone but has no refills, and motrin is not helping, routed to provider for review.

## 2016-03-25 NOTE — Telephone Encounter (Signed)
Spoke with patient and advised of rx sent by provider. 

## 2016-04-15 ENCOUNTER — Encounter: Payer: Self-pay | Admitting: Certified Nurse Midwife

## 2016-04-15 ENCOUNTER — Other Ambulatory Visit (HOSPITAL_COMMUNITY)
Admission: RE | Admit: 2016-04-15 | Discharge: 2016-04-15 | Disposition: A | Discharge status: Home / Self Care | Payer: Medicaid Other | Source: Ambulatory Visit | Attending: Certified Nurse Midwife | Admitting: Certified Nurse Midwife

## 2016-04-15 ENCOUNTER — Ambulatory Visit (INDEPENDENT_AMBULATORY_CARE_PROVIDER_SITE_OTHER): Payer: Medicaid Other | Admitting: Certified Nurse Midwife

## 2016-04-15 DIAGNOSIS — Z01419 Encounter for gynecological examination (general) (routine) without abnormal findings: Secondary | ICD-10-CM | POA: Insufficient documentation

## 2016-04-15 DIAGNOSIS — Z113 Encounter for screening for infections with a predominantly sexual mode of transmission: Secondary | ICD-10-CM | POA: Insufficient documentation

## 2016-04-15 DIAGNOSIS — F332 Major depressive disorder, recurrent severe without psychotic features: Secondary | ICD-10-CM

## 2016-04-15 DIAGNOSIS — F331 Major depressive disorder, recurrent, moderate: Secondary | ICD-10-CM

## 2016-04-15 MED ORDER — ESCITALOPRAM OXALATE 10 MG PO TABS: 10.0000 mg | ORAL_TABLET | Freq: Every day | ORAL | 5 refills | Status: DC

## 2016-04-15 NOTE — Progress Notes (Signed)
Post Partum Exam  Julia Webb is a 26 y.o. 691P1001 female who presents for a postpartum visit. She is 6 weeks postpartum following a spontaneous vaginal delivery. I have fully reviewed the prenatal and intrapartum course. The delivery was at 6670w6d gestational weeks.  Anesthesia: epidural. Postpartum course has been unremarkable. Baby's course has been unremarkable. Baby is feeding by bottle - Similac Alimentum. Bleeding no bleeding. Bowel function is normal. Bladder function is normal. Patient is sexually active. Contraception method is OCP (estrogen/progesterone). Postpartum depression screening: EPDS 16.  Denies any alcohol use today.  Denies any suicidal/homicidal ideations today.  States that she gets about 4 hours of sleep/night and is taking her Celexa.   LMP: 03/19/16.    The following portions of the patient's history were reviewed and updated as appropriate: allergies, current medications, past family history, past medical history, past social history, past surgical history and problem list.  Review of Systems Pertinent items noted in HPI and remainder of comprehensive ROS otherwise negative.    Objective:    BP 116/78 mmHg  Pulse 78  Resp 16  Ht 5\' 5"  (1.651 m)  Wt 211 lb (95.709 kg)  BMI 35.11 kg/m2  Breastfeeding? Yes  General:  alert, cooperative and no distress   Breasts:  inspection negative, no nipple discharge or bleeding, no masses or nodularity palpable  Lungs: clear to auscultation bilaterally  Heart:  regular rate and rhythm, S1, S2 normal, no murmur, click, rub or gallop  Abdomen: soft, non-tender; bowel sounds normal; no masses,  no organomegaly   Vulva:  normal  Vagina: normal vagina, no discharge, exudate, lesion, or erythema  Cervix:  no bleeding following Pap  Corpus: normal size, contour, position, consistency, mobility, non-tender  Adnexa:  normal adnexa  Rectal Exam: Not performed.        Last pap smear: 06/17/14: normal  Assessment:    Normal 6 week  postpartum exam. Pap smear done at today's visit.   Contraception options discussed.   Depression: meds changed to Latuda  Elevated blood pressure today  Plan:   1. Contraception: none 2.  Discussed nuva ring, depo or IUD as options.   3. Follow up in: 2 weeks with next period or as needed.  4. Psychiatry referral made.   5. Elevated blood pressure today: PCP referral made

## 2016-04-16 LAB — CERVICOVAGINAL ANCILLARY ONLY
Bacterial vaginitis: NEGATIVE
Candida vaginitis: POSITIVE
Chlamydia: NEGATIVE
NEISSERIA GONORRHEA: NEGATIVE
TRICH (WINDOWPATH): NEGATIVE

## 2016-04-19 ENCOUNTER — Other Ambulatory Visit: Payer: Self-pay | Admitting: Certified Nurse Midwife

## 2016-04-19 DIAGNOSIS — B3731 Acute candidiasis of vulva and vagina: Secondary | ICD-10-CM

## 2016-04-19 DIAGNOSIS — B373 Candidiasis of vulva and vagina: Secondary | ICD-10-CM

## 2016-04-19 MED ORDER — TERCONAZOLE 0.8 % VA CREA: 1.0000 | TOPICAL_CREAM | Freq: Every day | VAGINAL | 0 refills | Status: DC

## 2016-04-19 MED ORDER — FLUCONAZOLE 200 MG PO TABS: 200.0000 mg | ORAL_TABLET | Freq: Once | ORAL | 0 refills | Status: AC

## 2016-04-20 ENCOUNTER — Other Ambulatory Visit: Payer: Self-pay | Admitting: Certified Nurse Midwife

## 2016-04-20 LAB — CYTOLOGY - PAP: Diagnosis: NEGATIVE

## 2016-04-26 ENCOUNTER — Other Ambulatory Visit: Payer: Self-pay | Admitting: *Deleted

## 2016-04-26 DIAGNOSIS — Z30015 Encounter for initial prescription of vaginal ring hormonal contraceptive: Secondary | ICD-10-CM

## 2016-04-26 MED ORDER — ETONOGESTREL-ETHINYL ESTRADIOL 0.12-0.015 MG/24HR VA RING: VAGINAL_RING | VAGINAL | 12 refills | Status: DC

## 2016-04-26 NOTE — Progress Notes (Signed)
See lab note for order.

## 2016-04-27 ENCOUNTER — Telehealth: Payer: Self-pay

## 2016-04-27 NOTE — Telephone Encounter (Signed)
Spoke with pt about referral that was put in for her.  Instructed pt that since she will be a [facility] that she will need to go through the walk-in clinic.  Gave patient the hours, address, and phone number to reach the providers.

## 2016-10-08 ENCOUNTER — Other Ambulatory Visit: Payer: Self-pay | Admitting: Certified Nurse Midwife

## 2016-10-08 DIAGNOSIS — O99342 Other mental disorders complicating pregnancy, second trimester: Principal | ICD-10-CM

## 2016-10-08 DIAGNOSIS — F32A Depression, unspecified: Secondary | ICD-10-CM

## 2016-10-08 DIAGNOSIS — F329 Major depressive disorder, single episode, unspecified: Secondary | ICD-10-CM

## 2017-11-06 IMAGING — US US OB TRANSVAGINAL
1 series · 13 of 28 positions shown · non-contrast
Comparison: None.

CLINICAL DATA: Vaginal bleeding, gestational age by last menstrual
period 7 weeks and 3 days. Beta HCG [DATE].

EXAM:
OBSTETRIC <14 WK US AND TRANSVAGINAL OB US
TECHNIQUE: Both transabdominal and transvaginal ultrasound examinations were
performed for complete evaluation of the gestation as well as the
maternal uterus, adnexal regions, and pelvic cul-de-sac.
Transvaginal technique was performed to assess early pregnancy.

[Series 1: us ob transvaginal · 0.22mm/px · 13 of 94 slices shown]
[im 4/94]
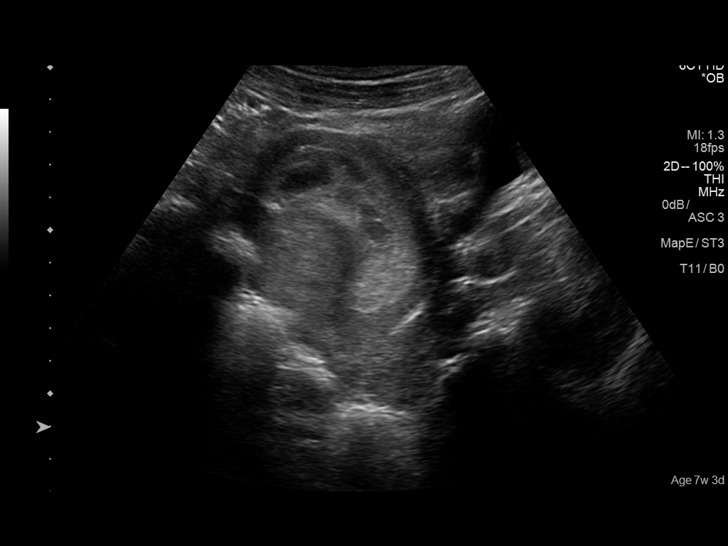
[im 11/94]
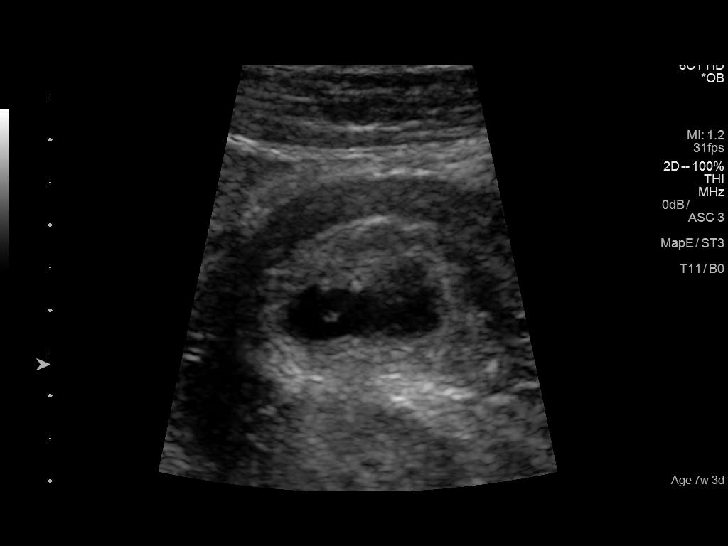
[im 18/94]
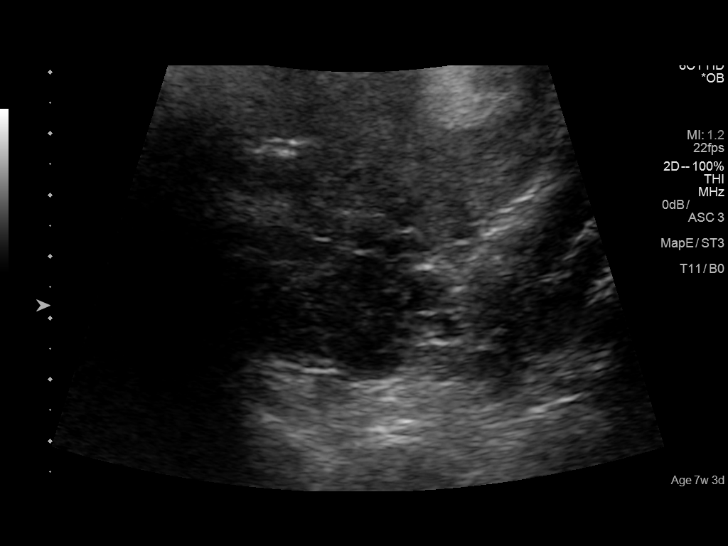
[im 25/94]
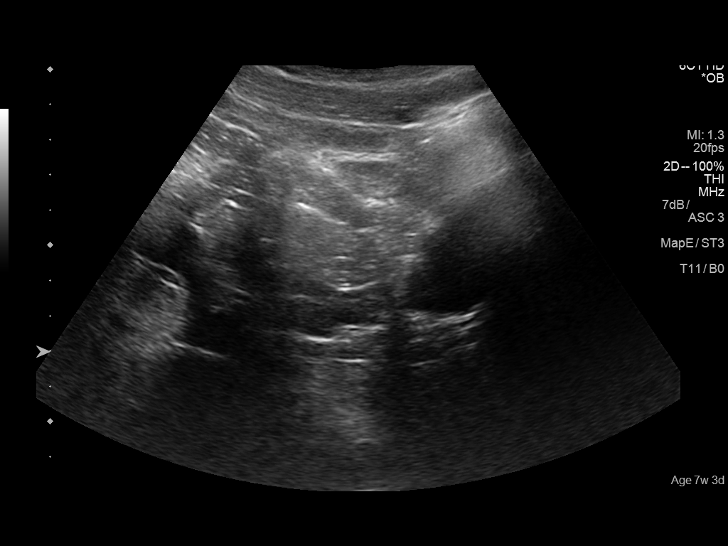
[im 32/94]
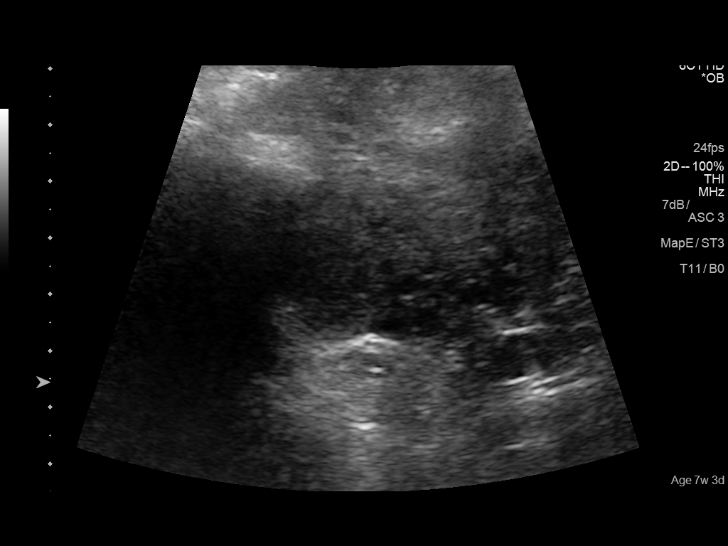
[im 38/94]
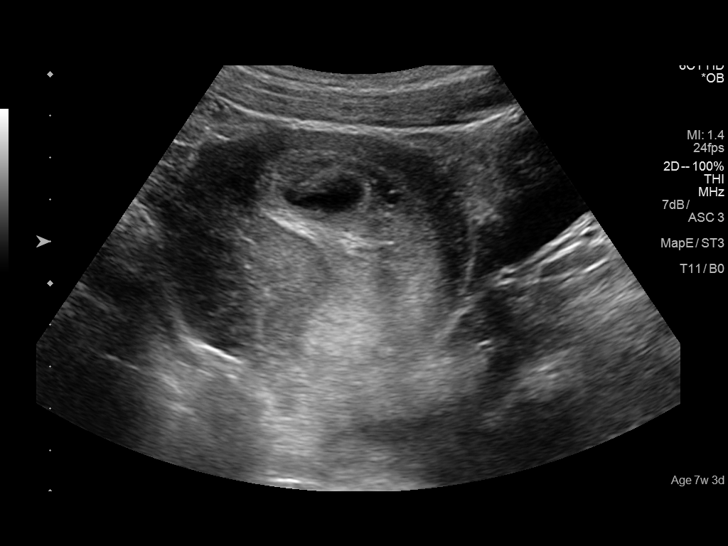
[im 49/94]
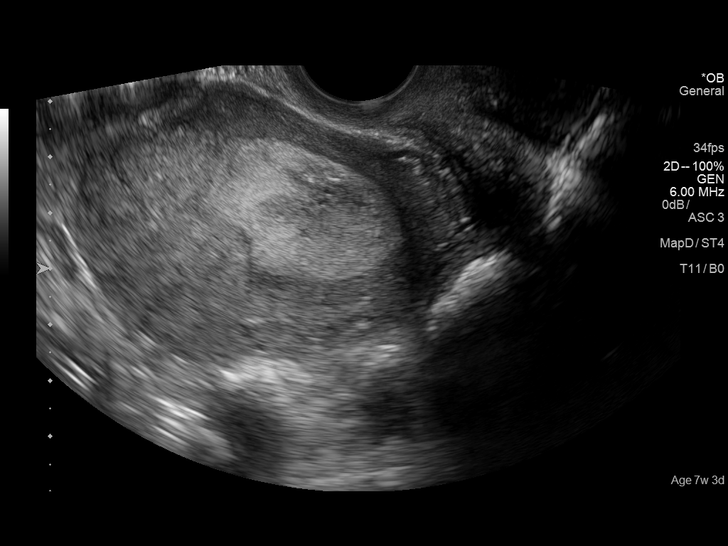
[im 56/94]
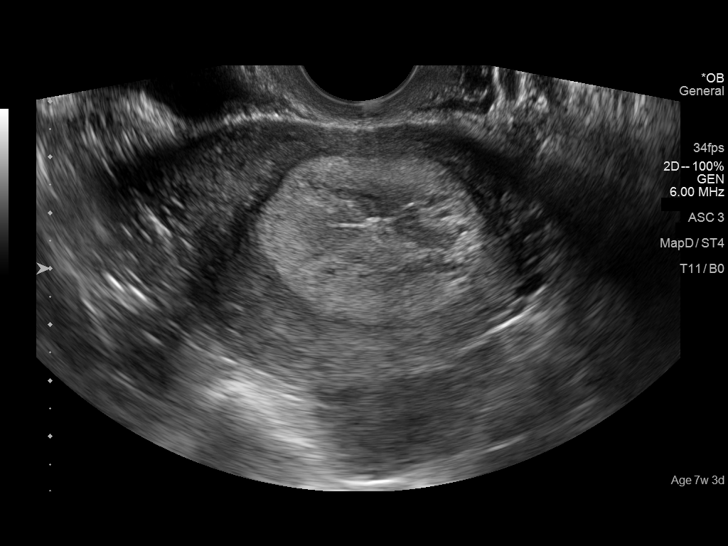
[im 63/94]
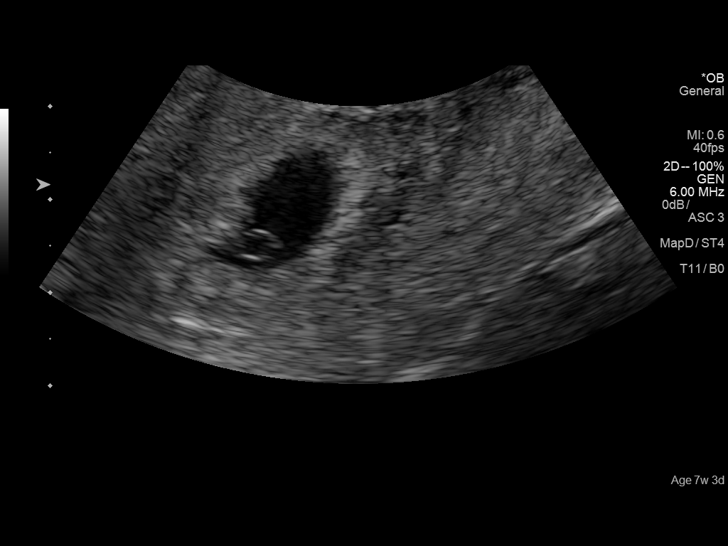
[im 69/94]
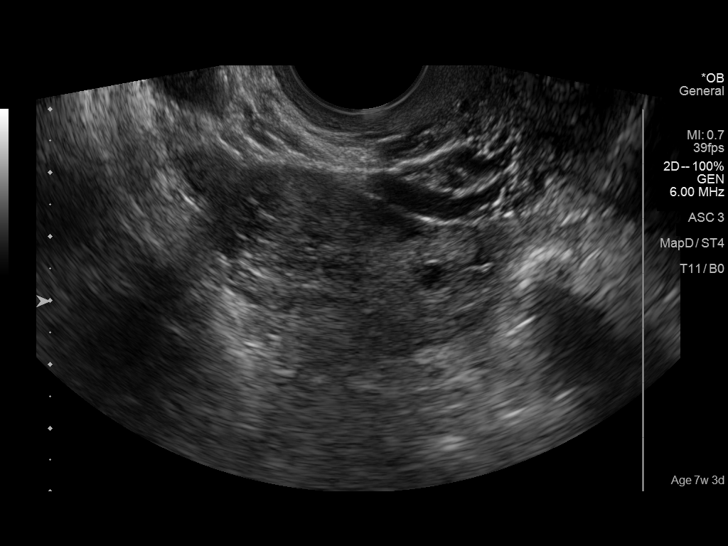
[im 76/94]
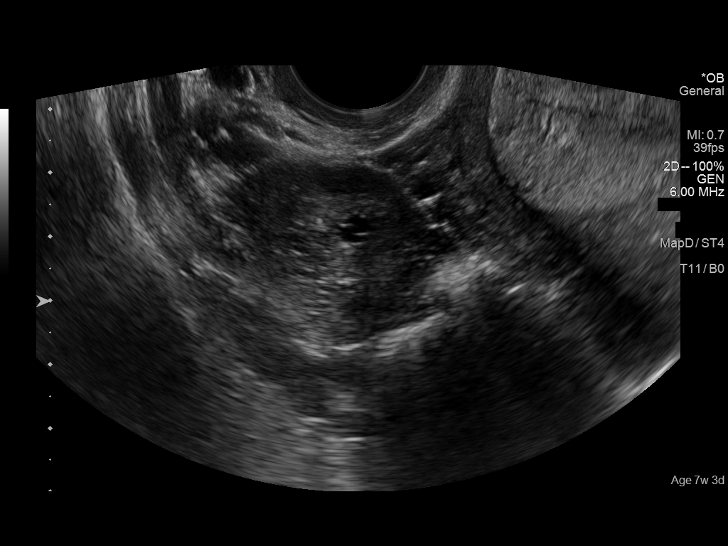
[im 83/94]
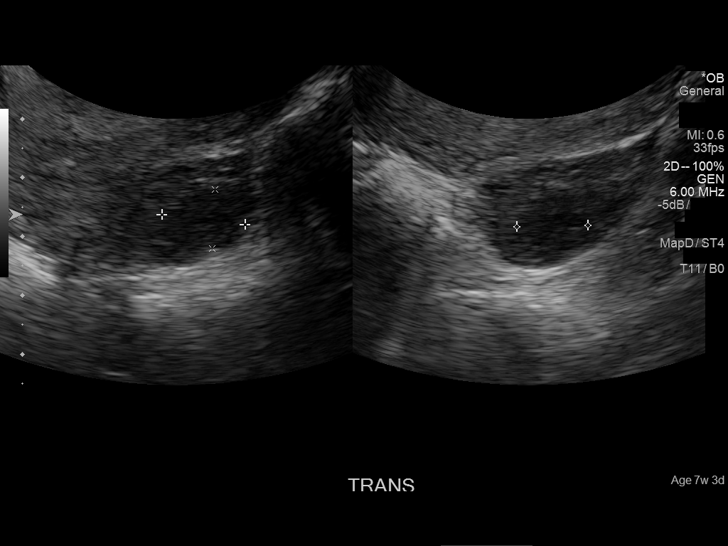
[im 90/94]
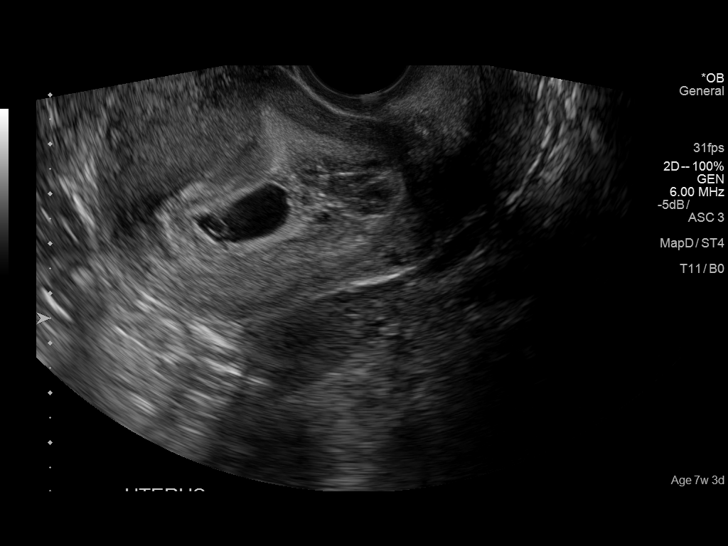

[13 of 28 positions shown; findings below may reference images not displayed]

FINDINGS: Intrauterine gestational sac: Present though is somewhat misshapen.

Yolk sac:  Present

Embryo:  Not present

Cardiac Activity: Not present

MSD: 13  mm   6 w   1  d

Subchorionic hemorrhage: Large 1.9 x 2.8 x 2.7 cm subchorionic
hemorrhage.

Maternal uterus/adnexae: Normal appearance of the adnexae, small
LEFT corpus luteal cyst. No free fluid.
IMPRESSION: Probable early intrauterine misshapen gestational sac, but no yolk
sac, fetal pole, or cardiac activity yet visualized. Large
subchorionic hemorrhage. Recommend follow-up quantitative B-HCG
levels and follow-up US in 14 days to confirm and assess viability.
This recommendation follows SRU consensus guidelines: Diagnostic
Criteria for Nonviable Pregnancy Early in the First Trimester. N
Engl J Med 2098; [DATE].

## 2018-04-11 ENCOUNTER — Inpatient Hospital Stay (HOSPITAL_COMMUNITY): Payer: Medicaid Other

## 2018-04-11 ENCOUNTER — Encounter (HOSPITAL_COMMUNITY): Payer: Self-pay

## 2018-04-11 ENCOUNTER — Ambulatory Visit (INDEPENDENT_AMBULATORY_CARE_PROVIDER_SITE_OTHER): Payer: Medicaid Other | Admitting: Advanced Practice Midwife

## 2018-04-11 ENCOUNTER — Encounter: Payer: Self-pay | Admitting: Advanced Practice Midwife

## 2018-04-11 ENCOUNTER — Other Ambulatory Visit (HOSPITAL_COMMUNITY)
Admission: RE | Admit: 2018-04-11 | Discharge: 2018-04-11 | Disposition: A | Discharge status: Home / Self Care | Payer: Medicaid Other | Source: Ambulatory Visit | Attending: Advanced Practice Midwife | Admitting: Advanced Practice Midwife

## 2018-04-11 ENCOUNTER — Other Ambulatory Visit: Payer: Self-pay

## 2018-04-11 ENCOUNTER — Inpatient Hospital Stay (HOSPITAL_COMMUNITY)
Admission: AD | Admit: 2018-04-11 | Discharge: 2018-04-11 | Disposition: A | Discharge status: Home / Self Care | Payer: Medicaid Other | Source: Ambulatory Visit | Attending: Family Medicine | Admitting: Family Medicine

## 2018-04-11 VITALS — BP 104/68 | HR 80 | Ht 65.0 in | Wt 130.0 lb

## 2018-04-11 DIAGNOSIS — Z3A01 Less than 8 weeks gestation of pregnancy: Secondary | ICD-10-CM

## 2018-04-11 DIAGNOSIS — Z87891 Personal history of nicotine dependence: Secondary | ICD-10-CM | POA: Diagnosis not present

## 2018-04-11 DIAGNOSIS — O418X11 Other specified disorders of amniotic fluid and membranes, first trimester, fetus 1: Secondary | ICD-10-CM

## 2018-04-11 DIAGNOSIS — O208 Other hemorrhage in early pregnancy: Secondary | ICD-10-CM

## 2018-04-11 DIAGNOSIS — Z01419 Encounter for gynecological examination (general) (routine) without abnormal findings: Secondary | ICD-10-CM

## 2018-04-11 DIAGNOSIS — R1032 Left lower quadrant pain: Secondary | ICD-10-CM | POA: Diagnosis not present

## 2018-04-11 DIAGNOSIS — Z3490 Encounter for supervision of normal pregnancy, unspecified, unspecified trimester: Secondary | ICD-10-CM

## 2018-04-11 DIAGNOSIS — O209 Hemorrhage in early pregnancy, unspecified: Secondary | ICD-10-CM | POA: Diagnosis present

## 2018-04-11 DIAGNOSIS — Z124 Encounter for screening for malignant neoplasm of cervix: Secondary | ICD-10-CM | POA: Diagnosis present

## 2018-04-11 DIAGNOSIS — Z3201 Encounter for pregnancy test, result positive: Secondary | ICD-10-CM

## 2018-04-11 DIAGNOSIS — Z3A09 9 weeks gestation of pregnancy: Secondary | ICD-10-CM | POA: Insufficient documentation

## 2018-04-11 DIAGNOSIS — Z0000 Encounter for general adult medical examination without abnormal findings: Secondary | ICD-10-CM | POA: Diagnosis not present

## 2018-04-11 DIAGNOSIS — O3680X0 Pregnancy with inconclusive fetal viability, not applicable or unspecified: Secondary | ICD-10-CM

## 2018-04-11 DIAGNOSIS — Z113 Encounter for screening for infections with a predominantly sexual mode of transmission: Secondary | ICD-10-CM

## 2018-04-11 DIAGNOSIS — N926 Irregular menstruation, unspecified: Secondary | ICD-10-CM

## 2018-04-11 DIAGNOSIS — N939 Abnormal uterine and vaginal bleeding, unspecified: Secondary | ICD-10-CM

## 2018-04-11 DIAGNOSIS — O26891 Other specified pregnancy related conditions, first trimester: Secondary | ICD-10-CM | POA: Insufficient documentation

## 2018-04-11 DIAGNOSIS — O468X1 Other antepartum hemorrhage, first trimester: Secondary | ICD-10-CM

## 2018-04-11 DIAGNOSIS — Z331 Pregnant state, incidental: Secondary | ICD-10-CM

## 2018-04-11 DIAGNOSIS — O418X10 Other specified disorders of amniotic fluid and membranes, first trimester, not applicable or unspecified: Secondary | ICD-10-CM

## 2018-04-11 LAB — CBC WITH DIFFERENTIAL/PLATELET
BASOS ABS: 0 10*3/uL (ref 0.0–0.1)
Basophils Relative: 0 %
EOS PCT: 1 %
Eosinophils Absolute: 0.1 10*3/uL (ref 0.0–0.5)
HCT: 34.3 % — ABNORMAL LOW (ref 36.0–46.0)
Hemoglobin: 11.2 g/dL — ABNORMAL LOW (ref 12.0–15.0)
LYMPHS ABS: 2.6 10*3/uL (ref 0.7–4.0)
Lymphocytes Relative: 26 %
MCH: 28.1 pg (ref 26.0–34.0)
MCHC: 32.7 g/dL (ref 30.0–36.0)
MCV: 86.2 fL (ref 80.0–100.0)
MONO ABS: 0.3 10*3/uL (ref 0.1–1.0)
MONOS PCT: 3 %
NRBC: 0 % (ref 0.0–0.2)
Neutro Abs: 6.9 10*3/uL (ref 1.7–7.7)
Neutrophils Relative %: 70 %
PLATELETS: 304 10*3/uL (ref 150–400)
RBC: 3.98 MIL/uL (ref 3.87–5.11)
RDW: 14.2 % (ref 11.5–15.5)
WBC: 10 10*3/uL (ref 4.0–10.5)

## 2018-04-11 LAB — COMPREHENSIVE METABOLIC PANEL
ALT: 19 U/L (ref 0–44)
ANION GAP: 8 (ref 5–15)
AST: 18 U/L (ref 15–41)
Albumin: 4 g/dL (ref 3.5–5.0)
Alkaline Phosphatase: 34 U/L — ABNORMAL LOW (ref 38–126)
BUN: 9 mg/dL (ref 6–20)
CALCIUM: 8.7 mg/dL — AB (ref 8.9–10.3)
CHLORIDE: 100 mmol/L (ref 98–111)
CO2: 22 mmol/L (ref 22–32)
CREATININE: 0.71 mg/dL (ref 0.44–1.00)
Glucose, Bld: 72 mg/dL (ref 70–99)
Potassium: 3.2 mmol/L — ABNORMAL LOW (ref 3.5–5.1)
Sodium: 130 mmol/L — ABNORMAL LOW (ref 135–145)
TOTAL PROTEIN: 7.3 g/dL (ref 6.5–8.1)
Total Bilirubin: 0.6 mg/dL (ref 0.3–1.2)

## 2018-04-11 LAB — URINALYSIS, ROUTINE W REFLEX MICROSCOPIC
Bilirubin Urine: NEGATIVE
Glucose, UA: NEGATIVE mg/dL
Hgb urine dipstick: NEGATIVE
KETONES UR: NEGATIVE mg/dL
Leukocytes, UA: NEGATIVE
Nitrite: NEGATIVE
PH: 7 (ref 5.0–8.0)
PROTEIN: NEGATIVE mg/dL
Specific Gravity, Urine: 1.002 — ABNORMAL LOW (ref 1.005–1.030)

## 2018-04-11 LAB — POCT URINE PREGNANCY: Preg Test, Ur: POSITIVE — AB

## 2018-04-11 LAB — HCG, QUANTITATIVE, PREGNANCY: HCG, BETA CHAIN, QUANT, S: 122356 m[IU]/mL — AB (ref <5)

## 2018-04-11 MED ORDER — PREPLUS 27-1 MG PO TABS
1.0000 | ORAL_TABLET | Freq: Every day | ORAL | 13 refills | Status: AC
Start: 2018-04-11 — End: ?

## 2018-04-11 NOTE — MAU Note (Signed)
PT sent from clinic for ectopic workup. Pt had a + pregnancy test at the clinic today. Vaginal bleeding that started on Christmas Day with lower abdominal cramping

## 2018-04-11 NOTE — Progress Notes (Signed)
Patient presents for Annual Exam today.   CC: irregular/change in periods recently started last month. Pt states cramps w/ period are very painful.   LMP:02/2018 Last pap:04/15/2016 WNL  STD Screening:desires  Contraception:None   UPT: Positive done twice confirmed pt name / label CNM reviewed Both UPT's. As well

## 2018-04-11 NOTE — Progress Notes (Signed)
Subjective:    Julia Webb is a 28 y.o. female who presents for an annual exam. The patient reports irregular menses.  LMP end of October/first of November then cramping and bleeding off and on in December but no real period.  She is sexually active and is not using any contraception. She denies any vaginal discharge/itching/burning.  There are no other symptoms.  GYN screening history: 2017 last pap: was normal. The patient wears seatbelts: yes. The patient participates in regular exercise: yes.The patient reports that there is not domestic violence in her life.   Menstrual History: OB History    Gravida  1   Para  1   Term  1   Preterm  0   AB  0   Living  1     SAB  0   TAB  0   Ectopic  0   Multiple  0   Live Births  1            No LMP recorded (approximate).    The following portions of the patient's history were reviewed and updated as appropriate: allergies, current medications, past family history, past medical history, past social history, past surgical history and problem list.  Review of Systems Pertinent items noted in HPI and remainder of comprehensive ROS otherwise negative.    Objective:  BP 104/68   Pulse 80   Ht 5\' 5"  (1.651 m)   Wt 59 kg   LMP  (Approximate)   BMI 21.63 kg/m  VS reviewed, nursing note reviewed,  Constitutional: well developed, well nourished, no distress HEENT: normocephalic CV: normal rate Pulm/chest wall: normal effort Breast Exam: Deferred Abdomen: soft Neuro: alert and oriented x 3 Skin: warm, dry Psych: affect normal Pelvic exam: Cervix pink, visually closed, without lesion, scant white creamy discharge, vaginal walls and external genitalia normal Bimanual exam: Cervix 0/long/high, firm, anterior, neg CMT, uterus nontender, nonenlarged, adnexa without tenderness, enlargement, or mass   Assessment/Plan:   1. Encounter for annual routine gynecological examination   2. Screening for STD (sexually transmitted  disease) --GCC collected.   3. Screening for cervical cancer - Cytology - PAP( [place])  4. Irregular periods  - POCT urine pregnancy  5. Pregnancy, incidental --Positive UPT in office today. --Discussed results with pt. Pt reports other pregnancy symptoms like breast tenderness and nausea but pt thinking they were her period starting.  6. Vaginal bleeding affecting early pregnancy --With vaginal bleeding in last 24 hours and cramping with positive pregnancy test, will send to MAU for ectopic evaluation. --Pt to MAU now after office visit.  Sharen Counter, CNM 10:25 AM

## 2018-04-11 NOTE — MAU Provider Note (Addendum)
History     CSN: [id number]  Arrival date and time: 04/11/18 9379   None     Chief Complaint  Patient presents with  . Vaginal Bleeding   [name] is a 28 yo female presenting with vaginal bleeding and abdominal cramping with a positive pregnancy test that was taken at [facility]. Patient claims that her LMP was around halloween and she began experiencing nausea and vomiting in early December which she managed effectively with Zofran. She then began experiencing heavy vaginal bleeding with abdominal pain/cramping on Christmas which lasted for a couple days and then went away. She claims that she continued to experience intermittent "achy/crampy" LLQ abdominal pain since Christmas. Patient states that the pain is worse in the evening after a day of work and it is often so severe that she is unable to pick up her 69 year old son and has to lay down. She states that the pain improves with laying down, using a heating massage pad on her abdomen and smoking marijuana. Associated symptoms include back/bilateral flank pain, dyspareunia, diarrhea, headaches, dizziness, and fever.  Patient expressed uncertainty of having another child as this was an unexpected pregnancy. She endorses multiple sexual partners.     Pertinent Gynecological History: Menses: irregular  Bleeding: intermenstrual bleeding Contraception: none DES exposure: denies Blood transfusions: none Sexually transmitted diseases: past history: exposure and currently at risk Previous GYN Procedures: denies  Last mammogram: N/A  Last pap: Specimen collected today (04/11/2018), results pending. 2018 results were normal   Past Medical History:  Diagnosis Date  . Alcohol abuse    Depression  . Chickenpox   . Depression   . Eating disorder    Bulemia, resolved  . Frequent headaches   . UTI (lower urinary tract infection)     Past Surgical History:  Procedure Laterality Date  . ROOT CANAL    .  ROOT CANAL      Family History  Problem Relation Age of Onset  . Hypertension Mother        Living  . Mental illness Mother   . Depression Mother   . Hypertension Father        Living  . Kidney Stones Father   . Sleep apnea Father   . Alcoholism Maternal Grandmother   . Hypertension Maternal Grandmother   . Arthritis Maternal Grandmother   . Prostate cancer Paternal Grandfather        Deceased  . Hypertension Paternal Grandmother   . Alzheimer's disease Other        Maternal Great Aunt  . Breast cancer Maternal Aunt 50  . HIV Brother   . Mental illness Brother        Possible Bipolar #1  . Healthy Sister        Half-sister    Social History   Tobacco Use  . Smoking status: Former Smoker    Packs/day: 0.00    Years: 1.00    Pack years: 0.00    Types: Cigars    Last attempt to quit: 08/09/2015    Years since quitting: 2.6  . Smokeless tobacco: Never Used  Substance Use Topics  . Alcohol use: No    Alcohol/week: 0.0 standard drinks  . Drug use: No    Comment: marijuana    Allergies:  Allergies  Allergen Reactions  . Citrus Itching, Nausea And Vomiting and Other (See Comments)    Pt states that she is allergic to all citrus fruits.    Marland Kitchen  Food Itching, Nausea And Vomiting and Other (See Comments)    Pt states that she is allergic to strawberries and watermelon.   . Latex Itching and Rash    Medications Prior to Admission  Medication Sig Dispense Refill Last Dose  . benzocaine-Menthol (DERMOPLAST) 20-0.5 % AERO Apply 1 application topically as needed for irritation (perineal discomfort). (Patient not taking: Reported on 04/15/2016) 78 g 0 Not Taking  . citalopram (CELEXA) 20 MG tablet take 1 tablet by mouth once daily (Patient not taking: Reported on 04/11/2018) 30 tablet 6 Not Taking  . cyclobenzaprine (FLEXERIL) 10 MG tablet Take 1 tablet (10 mg total) by mouth every 8 (eight) hours as needed for muscle spasms. (Patient not taking: Reported on 04/15/2016) 30 tablet 1  Not Taking  . escitalopram (LEXAPRO) 10 MG tablet Take 1 tablet (10 mg total) by mouth daily. (Patient not taking: Reported on 04/11/2018) 30 tablet 5 Not Taking  . etonogestrel-ethinyl estradiol (NUVARING) 0.12-0.015 MG/24HR vaginal ring Insert vaginally and leave in place for 3 consecutive weeks, then remove for 1 week. (Patient not taking: Reported on 04/11/2018) 1 each 12 Not Taking  . ibuprofen (ADVIL,MOTRIN) 600 MG tablet Take 1 tablet (600 mg total) by mouth every 6 (six) hours. (Patient not taking: Reported on 04/15/2016) 120 tablet 2 Not Taking  . Iron-FA-B Cmp-C-Biot-Probiotic (FUSION PLUS) CAPS Take 1 tablet by mouth daily. (Patient not taking: Reported on 04/15/2016) 30 capsule 2 Not Taking  . oxyCODONE-acetaminophen (PERCOCET/ROXICET) 5-325 MG tablet Take 1-2 tablets by mouth every 4 (four) hours as needed (for pain scale equal to or greater than 7.). (Patient not taking: Reported on 04/15/2016) 45 tablet 0 Not Taking  . Prenatal Vit-Fe Fumarate-FA (PRENATAL MULTIVITAMIN) TABS tablet Take 1 tablet by mouth daily.    Not Taking  . senna-docusate (SENOKOT-S) 8.6-50 MG tablet Take 2 tablets by mouth 2 (two) times daily. (Patient not taking: Reported on 04/15/2016) 90 tablet 2 Not Taking  . terconazole (TERAZOL 3) 0.8 % vaginal cream Place 1 applicator vaginally at bedtime. 20 g 0     Review of Systems  Constitutional: Positive for activity change, appetite change, fatigue and fever.  Gastrointestinal: Positive for abdominal pain, diarrhea, nausea and vomiting. Negative for constipation.  Genitourinary: Positive for dyspareunia, flank pain, menstrual problem, pelvic pain and vaginal bleeding. Negative for difficulty urinating, dysuria, frequency and urgency.  Allergic/Immunologic: Positive for food allergies.  Neurological: Positive for dizziness and headaches.  Psychiatric/Behavioral: The patient is not nervous/anxious.    Physical Exam   unknown if currently breastfeeding.  Physical Exam   Constitutional: She is oriented to person, place, and time. She appears well-developed and well-nourished. No distress.  HENT:  Head: Normocephalic and atraumatic.  GI: She exhibits no distension. There is abdominal tenderness (LLQ).  Neurological: She is alert and oriented to person, place, and time.  Skin: She is not diaphoretic.    MAU Course  Procedures  MDM 28 yo G63P1001 female presenting with vaginal bleeding and LLQ abdominal pain with positive pregnancy test that is most likely due to an ectopic pregnancy.  UA negative for UTI.  GC and chlamydia results pending to rule out PID.  Ultrasound ordered to confirm ectopic pregnancy. If confirmed, methotrexate protocol will be initiated     Results for orders placed or performed during the hospital encounter of 04/11/18 (from the past 24 hour(s))  CBC with Differential/Platelet     Status: Abnormal   Collection Time: 04/11/18 10:10 AM  Result Value Ref Range  WBC 10.0 4.0 - 10.5 K/uL   RBC 3.98 3.87 - 5.11 MIL/uL   Hemoglobin 11.2 (L) 12.0 - 15.0 g/dL   HCT 16.134.3 (L) 09.636.0 - 04.546.0 %   MCV 86.2 80.0 - 100.0 fL   MCH 28.1 26.0 - 34.0 pg   MCHC 32.7 30.0 - 36.0 g/dL   RDW 40.914.2 81.111.5 - 91.415.5 %   Platelets 304 150 - 400 K/uL   nRBC 0.0 0.0 - 0.2 %   Neutrophils Relative % 70 %   Neutro Abs 6.9 1.7 - 7.7 K/uL   Lymphocytes Relative 26 %   Lymphs Abs 2.6 0.7 - 4.0 K/uL   Monocytes Relative 3 %   Monocytes Absolute 0.3 0.1 - 1.0 K/uL   Eosinophils Relative 1 %   Eosinophils Absolute 0.1 0.0 - 0.5 K/uL   Basophils Relative 0 %   Basophils Absolute 0.0 0.0 - 0.1 K/uL  Comprehensive metabolic panel     Status: Abnormal   Collection Time: 04/11/18 10:10 AM  Result Value Ref Range   Sodium 130 (L) 135 - 145 mmol/L   Potassium 3.2 (L) 3.5 - 5.1 mmol/L   Chloride 100 98 - 111 mmol/L   CO2 22 22 - 32 mmol/L   Glucose, Bld 72 70 - 99 mg/dL   BUN 9 6 - 20 mg/dL   Creatinine, Ser 7.820.71 0.44 - 1.00 mg/dL   Calcium 8.7 (L) 8.9 - 10.3  mg/dL   Total Protein 7.3 6.5 - 8.1 g/dL   Albumin 4.0 3.5 - 5.0 g/dL   AST 18 15 - 41 U/L   ALT 19 0 - 44 U/L   Alkaline Phosphatase 34 (L) 38 - 126 U/L   Total Bilirubin 0.6 0.3 - 1.2 mg/dL   GFR calc non Af Amer >60 >60 mL/min   GFR calc Af Amer >60 >60 mL/min   Anion gap 8 5 - 15  hCG, quantitative, pregnancy     Status: Abnormal   Collection Time: 04/11/18 10:10 AM  Result Value Ref Range   hCG, Beta Chain, Quant, S 122,356 (H) <5 mIU/mL  Urinalysis, Routine w reflex microscopic     Status: Abnormal   Collection Time: 04/11/18 10:11 AM  Result Value Ref Range   Color, Urine COLORLESS (A) YELLOW   APPearance CLEAR CLEAR   Specific Gravity, Urine 1.002 (L) 1.005 - 1.030   pH 7.0 5.0 - 8.0   Glucose, UA NEGATIVE NEGATIVE mg/dL   Hgb urine dipstick NEGATIVE NEGATIVE   Bilirubin Urine NEGATIVE NEGATIVE   Ketones, ur NEGATIVE NEGATIVE mg/dL   Protein, ur NEGATIVE NEGATIVE mg/dL   Nitrite NEGATIVE NEGATIVE   Leukocytes, UA NEGATIVE NEGATIVE     Assessment and Plan  28 yo 552P1001 female presenting with vaginal bleeding and LLQ abdominal pain with positive pregnancy test that is most likely due to an ectopic pregnancy.  Awaiting pending lab results and ultrasound.   Philis KendallMelanie Mermiges PA Student 04/11/2018, 10:07 AM   Provider MDM I was present for student assessment and coordinated care as well as labs, imaging, and plan of care. Physical assessment was re-performed by me.  Patient Vitals for the past 24 hrs:  BP Temp Temp src Pulse Resp  04/11/18 1212 115/75 - - - -  04/11/18 1029 106/73 98.1 F (36.7 C) Oral 76 18    .  [place] Ob Comp Less 14 Wks  Result Date: 04/11/2018 CLINICAL DATA:  Vaginal bleeding.  Pregnancy. EXAM: OBSTETRIC <14 WK US AND TRANSVAGINAL OB [place]  TECHNIQUE: Both transabdominal and transvaginal ultrasound examinations were performed for complete evaluation of the gestation as well as the maternal uterus, adnexal regions, and pelvic cul-de-sac. Transvaginal  technique was performed to assess early pregnancy. COMPARISON:  Ultrasound 07/31/2015. FINDINGS: Intrauterine gestational sac: Single Yolk sac:  Present Embryo:  Present Cardiac Activity: Present Heart Rate: 180 bpm CRL: 25.5 mm   9 w   0 d                  [place] EDC: 11/14/2018 Subchorionic hemorrhage: Large 7.7 x 3.3 x 2.3 cm subchorionic hemorrhage. Maternal uterus/adnexae: Unremarkable. IMPRESSION: 1.  Single viable intrauterine pregnancy at 9 weeks 0 days. 2.  Large 7.7 x 3.3 x 2.3 cm subchorionic hemorrhage. Electronically Signed   By: Maisie Fus  Register   On: 04/11/2018 11:50     A/P: --28 y.o. G2P1001 with [facility] at [redacted]w[redacted]d  --Discharge home in stable condition --Rx for prenatal vitamins per patient request   F/U: Patient to establish [facility] around 11 weeks.  Clayton Bibles, CNM 04/11/18  12:25 PM

## 2018-04-11 NOTE — Patient Instructions (Signed)
Vaginal Bleeding During Pregnancy, First Trimester ° °A small amount of bleeding from the vagina (spotting) is relatively common during early pregnancy. It usually stops on its own. Various things may cause bleeding or spotting during early pregnancy. Some bleeding may be related to the pregnancy, and some may not. In many cases, the bleeding is normal and is not a problem. However, bleeding can also be a sign of something serious. Be sure to tell your health care provider about any vaginal bleeding right away. °Some possible causes of vaginal bleeding during the first trimester include: °· Infection or inflammation of the cervix. °· Growths (polyps) on the cervix. °· Miscarriage or threatened miscarriage. °· Pregnancy tissue developing outside of the uterus (ectopic pregnancy). °· A mass of tissue developing in the uterus due to an egg being fertilized incorrectly (molar pregnancy). °Follow these instructions at home: °Activity °· Follow instructions from your health care provider about limiting your activity. Ask what activities are safe for you. °· If needed, make plans for someone to help with your regular activities. °· Do not have sex or orgasms until your health care provider says that this is safe. °General instructions °· Take over-the-counter and prescription medicines only as told by your health care provider. °· Pay attention to any changes in your symptoms. °· Do not use tampons or douche. °· Write down how many pads you use each day, how often you change pads, and how soaked (saturated) they are. °· If you pass any tissue from your vagina, save the tissue so you can show it to your health care provider. °· Keep all follow-up visits as told by your health care provider. This is important. °Contact a health care provider if: °· You have vaginal bleeding during any part of your pregnancy. °· You have cramps or labor pains. °· You have a fever. °Get help right away if: °· You have severe cramps in your  back or abdomen. °· You pass large clots or a large amount of tissue from your vagina. °· Your bleeding increases. °· You feel light-headed or weak, or you faint. °· You have chills. °· You are leaking fluid or have a gush of fluid from your vagina. °Summary °· A small amount of bleeding (spotting) from the vagina is relatively common during early pregnancy. °· Various things may cause bleeding or spotting in early pregnancy. °· Be sure to tell your health care provider about any vaginal bleeding right away. °This information is not intended to replace advice given to you by your health care provider. Make sure you discuss any questions you have with your health care provider. °Document Released: 12/30/2004 Document Revised: 06/24/2016 Document Reviewed: 06/24/2016 °Elsevier Interactive Patient Education © 2019 [employer] Inc[employer] ° °

## 2018-04-11 NOTE — Discharge Instructions (Signed)

## 2018-04-12 LAB — CYTOLOGY - PAP
BACTERIAL VAGINITIS: POSITIVE — AB
CANDIDA VAGINITIS: POSITIVE — AB
CHLAMYDIA, DNA PROBE: NEGATIVE
DIAGNOSIS: NEGATIVE
HPV: NOT DETECTED
Neisseria Gonorrhea: NEGATIVE
Trichomonas: NEGATIVE

## 2018-04-21 ENCOUNTER — Encounter (HOSPITAL_COMMUNITY): Payer: Self-pay

## 2018-04-21 ENCOUNTER — Inpatient Hospital Stay (HOSPITAL_COMMUNITY)
Admission: AD | Admit: 2018-04-21 | Discharge: 2018-04-21 | Disposition: A | Discharge status: Home / Self Care | Payer: Medicaid Other | Source: Ambulatory Visit | Attending: Family Medicine | Admitting: Family Medicine

## 2018-04-21 DIAGNOSIS — Z87891 Personal history of nicotine dependence: Secondary | ICD-10-CM | POA: Insufficient documentation

## 2018-04-21 DIAGNOSIS — Z3A10 10 weeks gestation of pregnancy: Secondary | ICD-10-CM | POA: Diagnosis not present

## 2018-04-21 DIAGNOSIS — O210 Mild hyperemesis gravidarum: Secondary | ICD-10-CM | POA: Insufficient documentation

## 2018-04-21 DIAGNOSIS — O219 Vomiting of pregnancy, unspecified: Secondary | ICD-10-CM

## 2018-04-21 LAB — URINALYSIS, ROUTINE W REFLEX MICROSCOPIC
Bilirubin Urine: NEGATIVE
Glucose, UA: NEGATIVE mg/dL
Hgb urine dipstick: NEGATIVE
KETONES UR: 5 mg/dL — AB
LEUKOCYTES UA: NEGATIVE
NITRITE: NEGATIVE
PROTEIN: NEGATIVE mg/dL
Specific Gravity, Urine: 1.012 (ref 1.005–1.030)
pH: 6 (ref 5.0–8.0)

## 2018-04-21 MED ORDER — ONDANSETRON 4 MG PO TBDP: 4.0000 mg | ORAL_TABLET | Freq: Three times a day (TID) | ORAL | 0 refills | Status: AC | PRN

## 2018-04-21 MED ORDER — METOCLOPRAMIDE HCL 10 MG PO TABS: 10.0000 mg | ORAL_TABLET | Freq: Three times a day (TID) | ORAL | 0 refills | Status: AC | PRN

## 2018-04-21 MED ORDER — ONDANSETRON 8 MG PO TBDP
8.0000 mg | ORAL_TABLET | Freq: Once | ORAL | Status: AC
  Administered 2018-04-21: 8 mg via ORAL
  Filled 2018-04-21: qty 1

## 2018-04-21 MED ORDER — CYCLOBENZAPRINE HCL 10 MG PO TABS
10.0000 mg | ORAL_TABLET | Freq: Once | ORAL | Status: AC
  Administered 2018-04-21: 10 mg via ORAL
  Filled 2018-04-21: qty 1

## 2018-04-21 NOTE — Discharge Instructions (Signed)

## 2018-04-21 NOTE — MAU Provider Note (Signed)
Chief Complaint: Nausea and Emesis   First Provider Initiated Contact with Patient 04/21/18 1949     SUBJECTIVE HPI: Julia Webb is a 28 y.o. G2P1001 at [redacted]w[redacted]d who presents to Maternity Admissions via EMS reporting n/v. Symptoms began last week but worsened in the last 3 days. States she hasn't been able to keep down food or fluids since last night. Does not have antiemetic at home. Last BM was today and was loose. Abdomen is "sore" from vomiting. Denies fever/chills, diarrhea, or vaginal bleeding.   Location: abdomen Quality: sore Severity: 2/10 on pain scale Duration: 1 day Timing: constant Modifying factors: worse when vomits Associated signs and symptoms: none  Past Medical History:  Diagnosis Date  . Alcohol abuse    Depression  . Chickenpox   . Depression   . Eating disorder    Bulemia, resolved  . Frequent headaches   . UTI (lower urinary tract infection)    OB History  Gravida Para Term Preterm AB Living  2 1 1  0 0 1  SAB TAB Ectopic Multiple Live Births  0 0 0 0 1    # Outcome Date GA Lbr Len/2nd Weight Sex Delivery Anes PTL Lv  2 Current           1 Term 02/09/16 [redacted]w[redacted]d 05:46 / 00:22 2340 g M Vag-Spont EPI  LIV   Past Surgical History:  Procedure Laterality Date  . ROOT CANAL     Social History   Socioeconomic History  . Marital status: Single    Spouse name: Not on file  . Number of children: Not on file  . Years of education: Not on file  . Highest education level: Not on file  Occupational History  . Occupation: stylist  Social Needs  . Financial resource strain: Not on file  . Food insecurity:    Worry: Not on file    Inability: Not on file  . Transportation needs:    Medical: Not on file    Non-medical: Not on file  Tobacco Use  . Smoking status: Former Smoker    Packs/day: 0.00    Years: 1.00    Pack years: 0.00    Types: Cigars    Last attempt to quit: 08/09/2015    Years since quitting: 2.7  . Smokeless tobacco: Never Used  Substance  and Sexual Activity  . Alcohol use: Not Currently    Alcohol/week: 0.0 standard drinks    Comment: social drinker  . Drug use: Yes    Types: Marijuana    Comment: daily use of marijuana  . Sexual activity: Yes    Partners: Male    Birth control/protection: None  Lifestyle  . Physical activity:    Days per week: Not on file    Minutes per session: Not on file  . Stress: Not on file  Relationships  . Social connections:    Talks on phone: Not on file    Gets together: Not on file    Attends religious service: Not on file    Active member of club or organization: Not on file    Attends meetings of clubs or organizations: Not on file    Relationship status: Not on file  . Intimate partner violence:    Fear of current or ex partner: Not on file    Emotionally abused: Not on file    Physically abused: Not on file    Forced sexual activity: Not on file  Other Topics Concern  . Not on  file  Social History Narrative  . Not on file   Family History  Problem Relation Age of Onset  . Hypertension Mother        Living  . Mental illness Mother   . Depression Mother   . Hypertension Father        Living  . Kidney Stones Father   . Sleep apnea Father   . Alcoholism Maternal Grandmother   . Hypertension Maternal Grandmother   . Arthritis Maternal Grandmother   . Prostate cancer Paternal Grandfather        Deceased  . Hypertension Paternal Grandmother   . Alzheimer's disease Other        Maternal Great Aunt  . Breast cancer Maternal Aunt 50  . HIV Brother   . Mental illness Brother        Possible Bipolar #1  . Healthy Sister        Half-sister   Current Facility-Administered Medications on File Prior to Encounter  Medication Dose Route Frequency Provider Last Rate Last Dose  . lidocaine (PF) (XYLOCAINE) 1 % injection    Anesthesia Intra-op Mal Amabile, MD   4 mL at 02/09/16 6578   Current Outpatient Medications on File Prior to Encounter  Medication Sig Dispense  Refill  . Prenatal Vit-Fe Fumarate-FA (PREPLUS) 27-1 MG TABS Take 1 tablet by mouth daily. 30 tablet 13   Allergies  Allergen Reactions  . Citrus Itching, Nausea And Vomiting and Other (See Comments)    Pt states that she is allergic to all citrus fruits.    . Food Itching, Nausea And Vomiting and Other (See Comments)    Pt states that she is allergic to strawberries and watermelon.   . Latex Itching and Rash    I have reviewed patient's Past Medical Hx, Surgical Hx, Family Hx, Social Hx, medications and allergies.   Review of Systems  Constitutional: Negative.   Gastrointestinal: Positive for abdominal pain, nausea and vomiting. Negative for constipation and diarrhea.  Genitourinary: Negative.     OBJECTIVE Patient Vitals for the past 24 hrs:  BP Temp Pulse Resp SpO2 Height Weight  04/21/18 1935 121/69 98.4 F (36.9 C) 70 16 100 % - -  04/21/18 1927 - - - - - 5\' 5"  (1.651 m) 57.2 kg   Constitutional: Well-developed, well-nourished female in no acute distress.  Cardiovascular: normal rate & rhythm, no murmur Respiratory: normal rate and effort. Lung sounds clear throughout GI: Abd soft, non-tender, Pos BS x 4. No guarding or rebound tenderness MS: Extremities nontender, no edema, normal ROM Neurologic: Alert and oriented x 4.     LAB RESULTS Results for orders placed or performed during the hospital encounter of 04/21/18 (from the past 24 hour(s))  Urinalysis, Routine w reflex microscopic     Status: Abnormal   Collection Time: 04/21/18  7:31 PM  Result Value Ref Range   Color, Urine YELLOW YELLOW   APPearance CLEAR CLEAR   Specific Gravity, Urine 1.012 1.005 - 1.030   pH 6.0 5.0 - 8.0   Glucose, UA NEGATIVE NEGATIVE mg/dL   Hgb urine dipstick NEGATIVE NEGATIVE   Bilirubin Urine NEGATIVE NEGATIVE   Ketones, ur 5 (A) NEGATIVE mg/dL   Protein, ur NEGATIVE NEGATIVE mg/dL   Nitrite NEGATIVE NEGATIVE   Leukocytes, UA NEGATIVE NEGATIVE    IMAGING No results  found.  MAU COURSE Orders Placed This Encounter  Procedures  . Urinalysis, Routine w reflex microscopic  . Discharge patient   Meds ordered this encounter  Medications  . ondansetron (ZOFRAN-ODT) disintegrating tablet 8 mg  . cyclobenzaprine (FLEXERIL) tablet 10 mg  . ondansetron (ZOFRAN ODT) 4 MG disintegrating tablet    Sig: Take 1 tablet (4 mg total) by mouth every 8 (eight) hours as needed for nausea or vomiting.    Dispense:  20 tablet    Refill:  0    Order Specific Question:   Supervising Provider    Answer:   Adam PhenixARNOLD, JAMES G [[id number]]  . metoCLOPramide (REGLAN) 10 MG tablet    Sig: Take 1 tablet (10 mg total) by mouth every 8 (eight) hours as needed.    Dispense:  30 tablet    Refill:  0    Order Specific Question:   Supervising Provider    Answer:   Adam PhenixARNOLD, JAMES G [[id number]]    MDM FHT present via doppler Zofran odt given with moderate relief. Pt able to tolerate ginger ale. Will send home with rx for nausea meds  ASSESSMENT 1. Nausea and vomiting during pregnancy prior to [redacted] weeks gestation   2. [redacted] weeks gestation of pregnancy     PLAN Discharge home in stable condition. Rx zofran & reglan Call [facility] to schedule prenatal care  Follow-up Information    [facility]. Schedule an appointment as soon as possible for a visit.   Contact information: [address] [place] [phone]         Allergies as of 04/21/2018      Reactions   Citrus Itching, Nausea And Vomiting, Other (See Comments)   Pt states that she is allergic to all citrus fruits.     Food Itching, Nausea And Vomiting, Other (See Comments)   Pt states that she is allergic to strawberries and watermelon.    Latex Itching, Rash      Medication List    TAKE these medications   metoCLOPramide 10 MG tablet Commonly known as:  REGLAN Take 1 tablet (10 mg total) by mouth every 8 (eight) hours as needed.   ondansetron 4 MG disintegrating  tablet Commonly known as:  ZOFRAN ODT Take 1 tablet (4 mg total) by mouth every 8 (eight) hours as needed for nausea or vomiting.   PREPLUS 27-1 MG Tabs Take 1 tablet by mouth daily.        Judeth HornLawrence, Ikhlas Albo, NP 04/21/2018  9:44 PM

## 2018-04-21 NOTE — MAU Note (Addendum)
Pt reports nausea/vomiting x3 days. States she has vomited 6 times today. Pt reports that she is unable to keep anything down. Pt also reports 4-5 loose stools in the past 24 hours. Pt denies fever, chills or recent sick contacts. Pt reports abdominal pain and back pain "for awhile" but has "just ignored it". Pt denies vaginal bleeding or discharge.

## 2018-06-13 ENCOUNTER — Ambulatory Visit: Payer: Self-pay

## 2018-06-13 ENCOUNTER — Emergency Department (HOSPITAL_COMMUNITY)
Admission: EM | Admit: 2018-06-13 | Discharge: 2018-06-13 | Disposition: A | Discharge status: Home / Self Care | Payer: Medicaid Other | Attending: Emergency Medicine | Admitting: Emergency Medicine

## 2018-06-13 ENCOUNTER — Other Ambulatory Visit: Payer: Self-pay

## 2018-06-13 ENCOUNTER — Encounter (HOSPITAL_COMMUNITY): Payer: Self-pay | Admitting: Emergency Medicine

## 2018-06-13 DIAGNOSIS — W228XXA Striking against or struck by other objects, initial encounter: Secondary | ICD-10-CM | POA: Insufficient documentation

## 2018-06-13 DIAGNOSIS — Z91040 Latex allergy status: Secondary | ICD-10-CM | POA: Diagnosis not present

## 2018-06-13 DIAGNOSIS — Y929 Unspecified place or not applicable: Secondary | ICD-10-CM | POA: Diagnosis not present

## 2018-06-13 DIAGNOSIS — Y939 Activity, unspecified: Secondary | ICD-10-CM | POA: Diagnosis not present

## 2018-06-13 DIAGNOSIS — S0990XA Unspecified injury of head, initial encounter: Secondary | ICD-10-CM | POA: Diagnosis present

## 2018-06-13 DIAGNOSIS — Z79899 Other long term (current) drug therapy: Secondary | ICD-10-CM | POA: Insufficient documentation

## 2018-06-13 DIAGNOSIS — Y999 Unspecified external cause status: Secondary | ICD-10-CM | POA: Insufficient documentation

## 2018-06-13 DIAGNOSIS — Z87891 Personal history of nicotine dependence: Secondary | ICD-10-CM | POA: Insufficient documentation

## 2018-06-13 DIAGNOSIS — S0101XA Laceration without foreign body of scalp, initial encounter: Secondary | ICD-10-CM | POA: Insufficient documentation

## 2018-06-13 MED ORDER — LIDOCAINE HCL (PF) 1 % IJ SOLN
5.0000 mL | Freq: Once | INTRAMUSCULAR | Status: DC
  Filled 2018-06-13: qty 30

## 2018-06-13 MED ORDER — IBUPROFEN 800 MG PO TABS
800.0000 mg | ORAL_TABLET | Freq: Once | ORAL | Status: AC
  Administered 2018-06-13: 800 mg via ORAL
  Filled 2018-06-13: qty 1
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
## 2018-06-13 NOTE — ED Notes (Signed)
Bed: WTR9 Expected date:  Expected time:  Means of arrival:  Comments: 
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
## 2018-06-13 NOTE — Discharge Instructions (Addendum)
Get help right away if: Your wound reopens and is draining. Your wound becomes red, swollen, hot, or tender. You develop a rash after the glue is applied. You have increasing pain in the wound. You have a red streak going away from the wound. You have pus coming from the wound. You have increased bleeding. You have a fever. You have shaking chills. You notice a bad smell coming from the wound. Your wound or the adhesive breaks open. 

## 2019-06-18 ENCOUNTER — Ambulatory Visit: Payer: Medicaid Other | Admitting: Advanced Practice Midwife

## 2020-05-12 IMAGING — US US OB COMP LESS 14 WK
1 series · 15 of 28 positions shown · non-contrast
Comparison: Ultrasound 07/31/2015.

CLINICAL DATA: Vaginal bleeding.  Pregnancy.

EXAM:
OBSTETRIC <14 WK US AND TRANSVAGINAL OB US
TECHNIQUE: Both transabdominal and transvaginal ultrasound examinations were
performed for complete evaluation of the gestation as well as the
maternal uterus, adnexal regions, and pelvic cul-de-sac.
Transvaginal technique was performed to assess early pregnancy.

[Series 1: us ob comp less 14 wk · 15 of 69 slices shown]
[im 1/69]
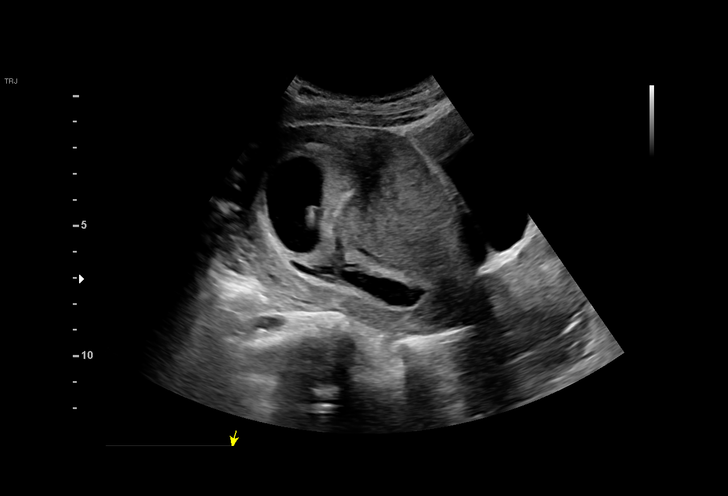
[im 6/69]
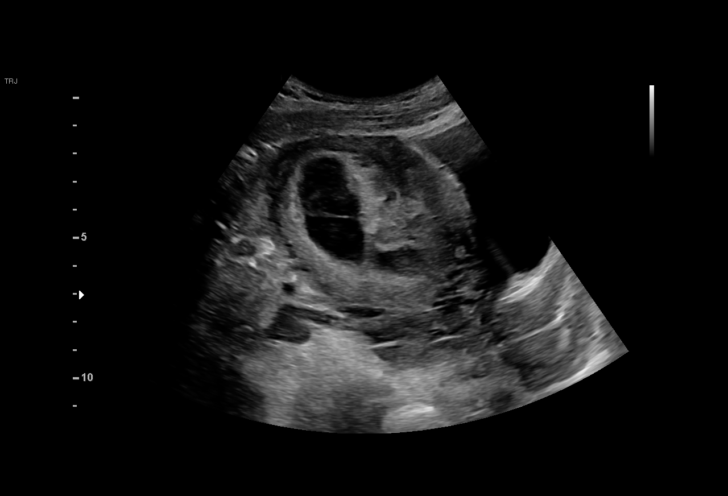
[im 11/69]
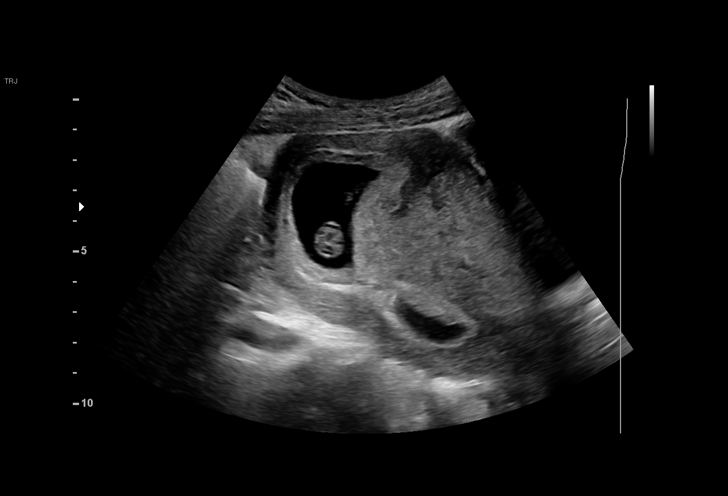
[im 16/69]
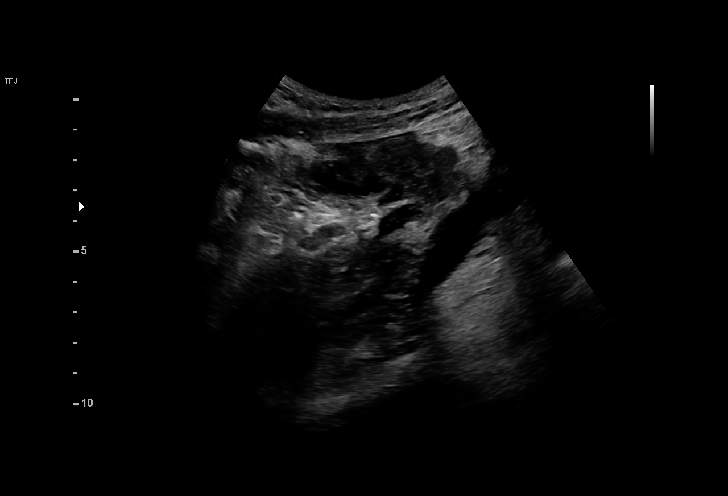
[im 21/69]
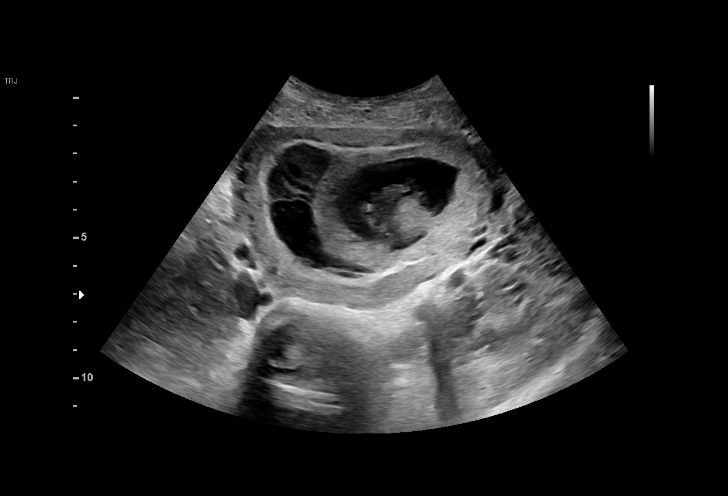
[im 26/69]
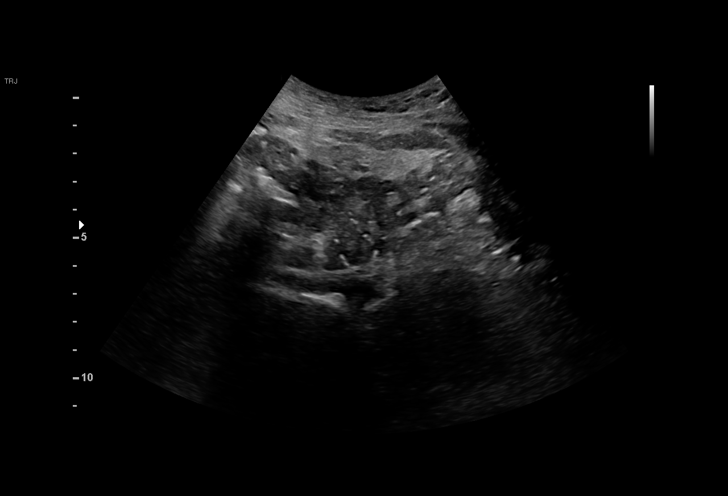
[im 31/69]
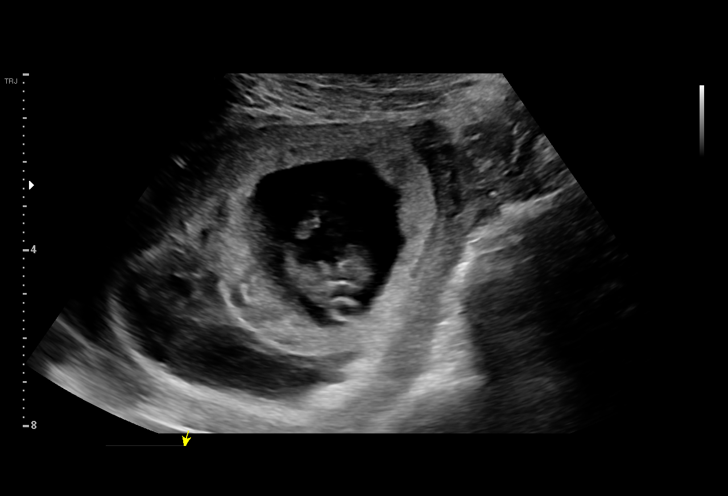
[im 36/69]
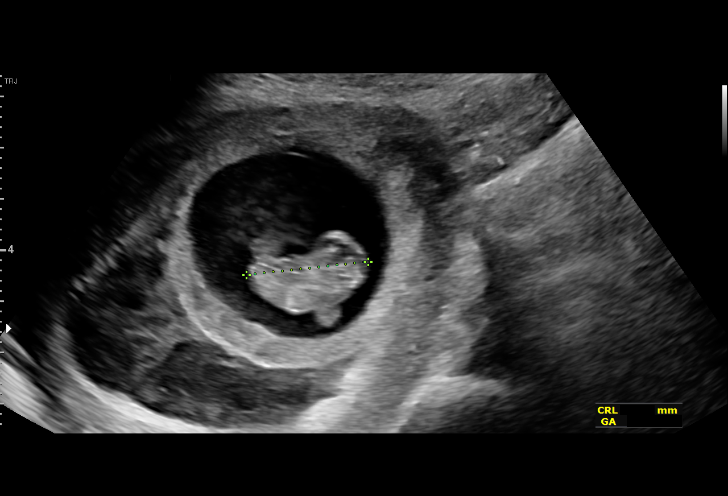
[im 38/69]
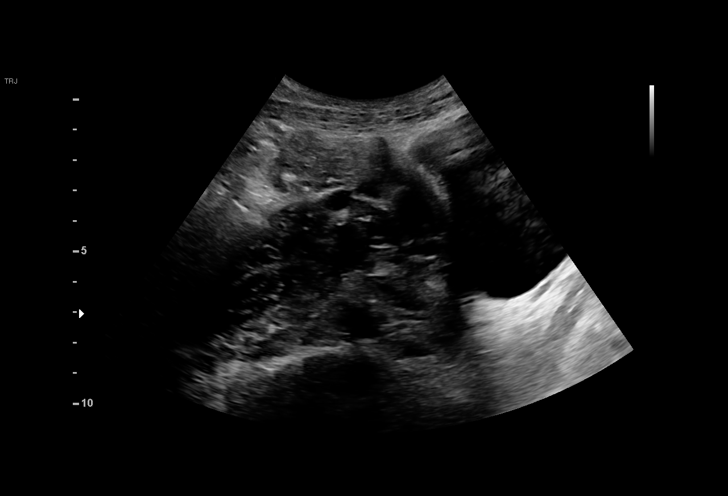
[im 43/69]
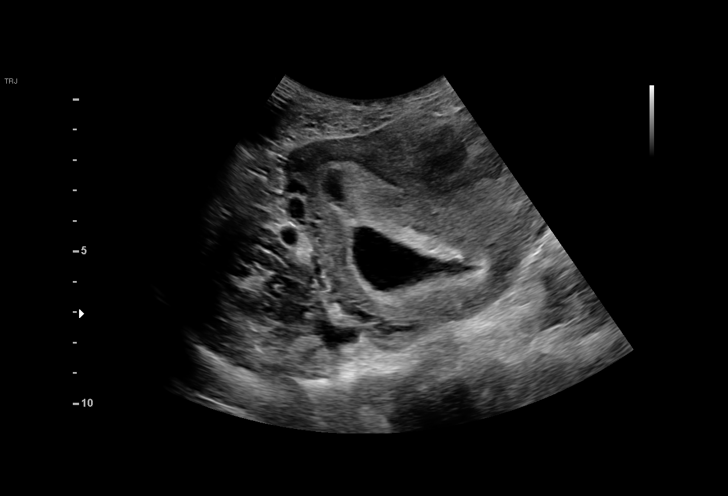
[im 48/69]
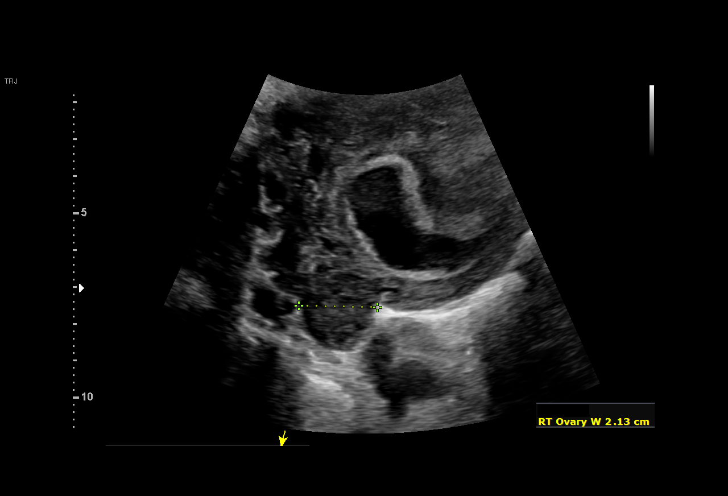
[im 53/69]
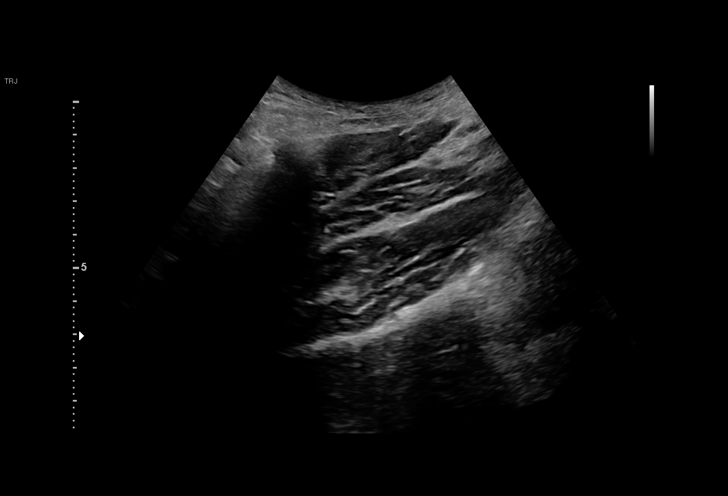
[im 58/69]
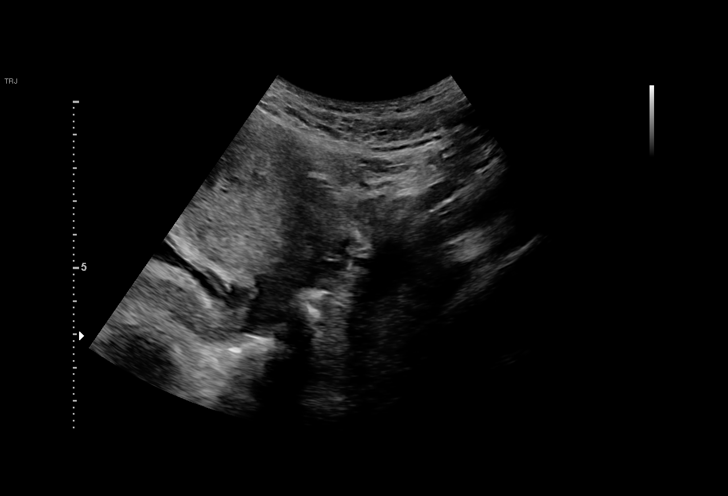
[im 63/69]
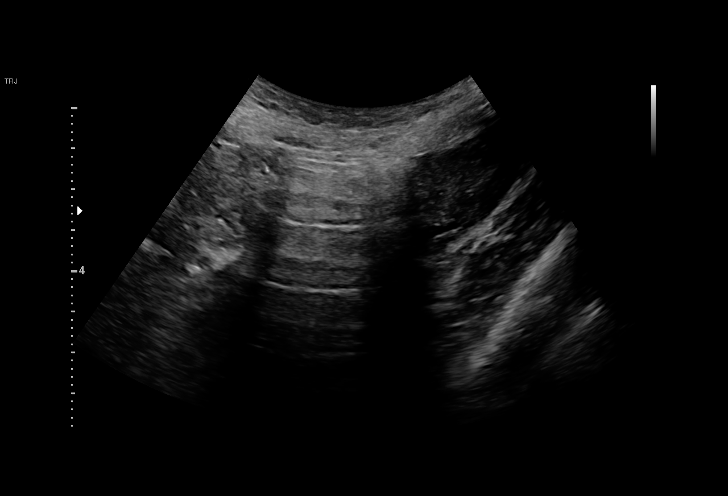
[im 69/69]
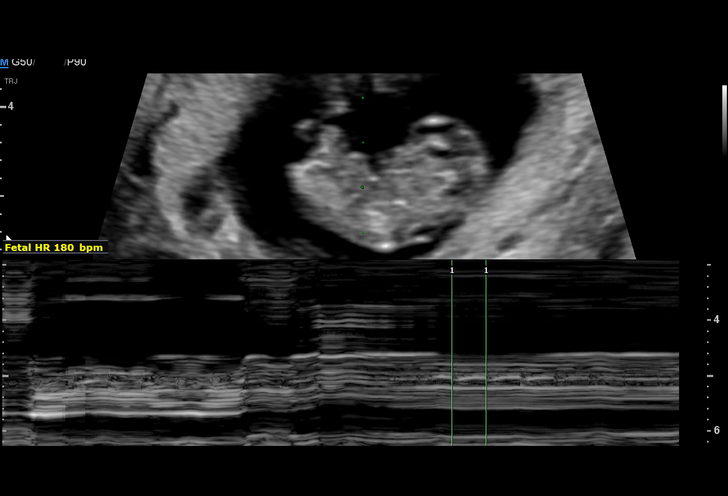

[15 of 28 positions shown; findings below may reference images not displayed]

FINDINGS: Intrauterine gestational sac: Single

Yolk sac:  Present

Embryo:  Present

Cardiac Activity: Present

Heart Rate: 180 bpm

CRL: 25.5 mm   9 w   0 d                  US EDC: 11/14/2018

Subchorionic hemorrhage: Large 7.7 x 3.3 x 2.3 cm subchorionic
hemorrhage.

Maternal uterus/adnexae: Unremarkable.
IMPRESSION: 1.  Single viable intrauterine pregnancy at 9 weeks 0 days.

2.  Large 7.7 x 3.3 x 2.3 cm subchorionic hemorrhage.
